# Patient Record
Sex: Female | Born: 1995 | Race: Black or African American | Hispanic: No | Marital: Single | Smoking: Current every day smoker
Health system: Southern US, Community
[De-identification: ages and names within clinical notes are randomized; demographics above are authoritative.]

## PROBLEM LIST (undated history)

## (undated) DIAGNOSIS — R0600 Dyspnea, unspecified: Secondary | ICD-10-CM

## (undated) DIAGNOSIS — M2550 Pain in unspecified joint: Secondary | ICD-10-CM

## (undated) DIAGNOSIS — F419 Anxiety disorder, unspecified: Secondary | ICD-10-CM

## (undated) DIAGNOSIS — R079 Chest pain, unspecified: Secondary | ICD-10-CM

## (undated) DIAGNOSIS — G4700 Insomnia, unspecified: Secondary | ICD-10-CM

## (undated) DIAGNOSIS — R5383 Other fatigue: Secondary | ICD-10-CM

## (undated) DIAGNOSIS — R600 Localized edema: Secondary | ICD-10-CM

## (undated) DIAGNOSIS — D649 Anemia, unspecified: Secondary | ICD-10-CM

## (undated) DIAGNOSIS — M549 Dorsalgia, unspecified: Secondary | ICD-10-CM

## (undated) HISTORY — DX: Anemia, unspecified: D64.9

## (undated) HISTORY — DX: Pain in unspecified joint: M25.50

## (undated) HISTORY — DX: Localized edema: R60.0

## (undated) HISTORY — DX: Dorsalgia, unspecified: M54.9

## (undated) HISTORY — PX: LIPOSUCTION: SHX10

## (undated) HISTORY — DX: Dyspnea, unspecified: R06.00

## (undated) HISTORY — DX: Other fatigue: R53.83

## (undated) HISTORY — DX: Chest pain, unspecified: R07.9

## (undated) HISTORY — PX: EYELID REPAIR W/ SKIN GRAFT: SHX1565

## (undated) HISTORY — PX: BUTTOCK LIFT: SHX1278

## (undated) HISTORY — DX: Insomnia, unspecified: G47.00

## (undated) HISTORY — DX: Anxiety disorder, unspecified: F41.9

---
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
## 2014-07-26 ENCOUNTER — Encounter (HOSPITAL_COMMUNITY): Payer: Self-pay | Admitting: Emergency Medicine

## 2014-07-26 ENCOUNTER — Emergency Department (INDEPENDENT_AMBULATORY_CARE_PROVIDER_SITE_OTHER)
Admission: EM | Admit: 2014-07-26 | Discharge: 2014-07-26 | Disposition: A | Discharge status: Home / Self Care | Payer: Self-pay | Source: Home / Self Care

## 2014-07-26 DIAGNOSIS — N390 Urinary tract infection, site not specified: Secondary | ICD-10-CM

## 2014-07-26 LAB — POCT URINALYSIS DIP (DEVICE)
Bilirubin Urine: NEGATIVE
Glucose, UA: NEGATIVE mg/dL
Ketones, ur: NEGATIVE mg/dL
Nitrite: POSITIVE — AB
Protein, ur: 30 mg/dL — AB
Specific Gravity, Urine: 1.015 (ref 1.005–1.030)
Urobilinogen, UA: 1 mg/dL (ref 0.0–1.0)
pH: 8 (ref 5.0–8.0)

## 2014-07-26 LAB — POCT PREGNANCY, URINE: Preg Test, Ur: NEGATIVE

## 2014-07-26 MED ORDER — CIPROFLOXACIN HCL 500 MG PO TABS: 500.0000 mg | ORAL_TABLET | Freq: Two times a day (BID) | ORAL | Status: DC

## 2014-07-26 NOTE — ED Notes (Signed)
C/o uti States she has pressure, odor and blood in urine

## 2014-07-26 NOTE — ED Provider Notes (Addendum)
CSN: [id number]     Arrival date & time 07/26/14  1857 History   First MD Initiated Contact with Patient 07/26/14 2011     Chief Complaint  Patient presents with  . Urinary Tract Infection   (Consider location/radiation/quality/duration/timing/severity/associated sxs/prior Treatment) Patient is a 19 y.o. female presenting with urinary tract infection. The history is provided by the patient.  Urinary Tract Infection Pain quality:  Burning Pain severity:  Mild Onset quality:  Gradual Duration:  2 days Timing:  Intermittent Progression:  Waxing and waning Chronicity:  New Recent urinary tract infections: no   Relieved by:  Nothing Worsened by:  Nothing tried Urinary symptoms: foul-smelling urine and frequent urination     History reviewed. No pertinent past medical history. History reviewed. No pertinent past surgical history. History reviewed. No pertinent family history. History  Substance Use Topics  . Smoking status: Never Smoker   . Smokeless tobacco: Not on file  . Alcohol Use: Yes     Comment: weekend drinker   OB History    No data available     Review of Systems  Constitutional: Negative.   HENT: Negative.   Eyes: Negative.   Respiratory: Negative.   Cardiovascular: Negative.   Gastrointestinal: Negative.   Genitourinary: Positive for dysuria and hematuria.  Musculoskeletal: Positive for myalgias.  Skin: Negative.   Neurological: Negative.     Allergies  Review of patient's allergies indicates no known allergies.  Home Medications   Prior to Admission medications   Medication Sig Start Date End Date Taking? Authorizing Provider  metroNIDAZOLE (FLAGYL) 500 MG tablet Take 1 tablet (500 mg total) by mouth 2 (two) times daily. 03/11/12   Morrell RiddleSarah L Weber, PA-C   BP 107/75 mmHg  Pulse 75  Temp(Src) 97.2 F (36.2 C) (Oral)  Resp 16  SpO2 100%  LMP 07/16/2014 Physical Exam  Constitutional: She is oriented to person, place, and time. She appears  well-developed and well-nourished.  HENT:  Head: Normocephalic and atraumatic.  Right Ear: External ear normal.  Left Ear: External ear normal.  Nose: Nose normal.  Mouth/Throat: Oropharynx is clear and moist.  Eyes: Conjunctivae and EOM are normal. Pupils are equal, round, and reactive to light.  Neck: Normal range of motion. Neck supple.  Pulmonary/Chest: Effort normal.  Abdominal: There is no tenderness.  Neurological: She is alert and oriented to person, place, and time.  Skin: Skin is warm and dry.  Psychiatric: She has a normal mood and affect. Her behavior is normal. Judgment and thought content normal.  Nursing note and vitals reviewed.   ED Course  Procedures (including critical care time) Labs Review Labs Reviewed  POCT URINALYSIS DIP (DEVICE) - Abnormal; Notable for the following:    Hgb urine dipstick SMALL (*)    Protein, ur 30 (*)    Nitrite POSITIVE (*)    Leukocytes, UA TRACE (*)    All other components within normal limits  POCT PREGNANCY, URINE    Imaging Review No results found.   MDM       ICD-9-CM ICD-10-CM   1. UTI (lower urinary tract infection) 599.0 N39.0 ciprofloxacin (CIPRO) 500 MG tablet     Signed, Elvina SidleKurt Corgan Mormile, MD   Elvina SidleKurt Benford Asch, MD 07/26/14 2030  Elvina SidleKurt Marijayne Rauth, MD 07/26/14 2043

## 2014-07-26 NOTE — Discharge Instructions (Signed)
Antibiotic Medication °Antibiotics are among the most frequently prescribed medicines. Antibiotics cure illness by assisting our body to injure or kill the bacteria that cause infection. While antibiotics are useful to treat a wide variety of infections they are useless against viruses. Antibiotics cannot cure colds, flu, or other viral infections.  °There are many types of antibiotics available. Your caregiver will decide which antibiotic will be useful for an illness. Never take or give someone else's antibiotics or left over medicine. °Your caregiver may also take into account: °· Allergies. °· The cost of the medicine. °· Dosing schedules. °· Taste. °· Common side effects when choosing an antibiotic for an infection. °Ask your caregiver if you have questions about why a certain medicine was chosen. °HOME CARE INSTRUCTIONS °Read all instructions and labels on medicine bottles carefully. Some antibiotics should be taken on an empty stomach while others should be taken with food. Taking antibiotics incorrectly may reduce how well they work. Some antibiotics need to be kept in the refrigerator. Others should be kept at room temperature. Ask your caregiver or pharmacist if you do not understand how to give the medicine. °Be sure to give the amount of medicine your caregiver has prescribed. Even if you feel better and your symptoms improve, bacteria may still remain alive in the body. Taking all of the medicine will prevent: °· The infection from returning and becoming harder to treat. °· Complications from partially treated infections. °If there is any medicine left over after you have taken the medicine as your caregiver has instructed, throw the medicine away. °Be sure to tell your caregiver if you: °· Are allergic to any medicines. °· Are pregnant or intend to become pregnant while using this medicine. °· Are breastfeeding. °· Are taking any other prescription, non-prescription medicine, or herbal  remedies. °· Have any other medical conditions or problems you have not already discussed. °If you are taking birth control pills, they may not work while you are on antibiotics. To avoid unwanted pregnancy: °· Continue taking your birth control pills as usual. °· Use a second form of birth control (such as condoms) while you are taking antibiotic medicine. °· When you finish taking the antibiotic medicine, continue using the second form of birth control until you are finished with your current 1 month cycle of birth control pills. °Try not to miss any doses of medicine. If you miss a dose, take it as soon as possible. However, if it is almost time for the next dose and the dosing schedule is: °· 2 doses a day, take the missed dose and the next dose 5 to 6 hours apart. °· 3 or more doses a day, take the missed dose and the next dose 2 to 4 hours apart, then go back to the normal schedule. °· If you are unable to make up a missed dose, take the next scheduled dose on time and complete the missed dose at the end of the prescribed time for your medicine. °SIDE EFFECTS TO TAKING ANTIBIOTICS °Common side effects to antibiotic use include: °· Soft stools or diarrhea. °· Mild stomach upset. °· Sun sensitivity. °SEEK MEDICAL CARE IF:  °· If you get worse or do not improve within a few days of starting the medicine. °· Vomiting develops. °· Diaper rash or rash on the genitals appears. °· Vaginal itching occurs. °· White patches appear on the tongue or in the mouth. °· Severe watery diarrhea and abdominal cramps occur. °· Signs of an allergy develop (hives, unknown   itchy rash appears). STOP TAKING THE ANTIBIOTIC. °SEEK IMMEDIATE MEDICAL CARE IF:  °· Urine turns dark or blood colored. °· Skin turns yellow. °· Easy bruising or bleeding occurs. °· Joint pain or muscle aches occur. °· Fever returns. °· Severe headache occurs. °· Signs of an allergy develop (trouble breathing, wheezing, swelling of the lips, face or tongue,  fainting, or blisters on the skin or in the mouth). STOP TAKING THE ANTIBIOTIC. °Document Released: 09/16/2003 Document Revised: 03/28/2011 Document Reviewed: 09/25/2008 °ExitCare® Patient Information ©2015 [employer]. This information is not intended to replace advice given to you by your health care provider. Make sure you discuss any questions you have with your health care provider. ° °Urinary Tract Infection °Urinary tract infections (UTIs) can develop anywhere along your urinary tract. Your urinary tract is your body's drainage system for removing wastes and extra water. Your urinary tract includes two kidneys, two ureters, a bladder, and a urethra. Your kidneys are a pair of bean-shaped organs. Each kidney is about the size of your fist. They are located below your ribs, one on each side of your spine. °CAUSES °Infections are caused by microbes, which are microscopic organisms, including fungi, viruses, and bacteria. These organisms are so small that they can only be seen through a microscope. Bacteria are the microbes that most commonly cause UTIs. °SYMPTOMS  °Symptoms of UTIs may vary by age and gender of the patient and by the location of the infection. Symptoms in young women typically include a frequent and intense urge to urinate and a painful, burning feeling in the bladder or urethra during urination. Older women and men are more likely to be tired, shaky, and weak and have muscle aches and abdominal pain. A fever may mean the infection is in your kidneys. Other symptoms of a kidney infection include pain in your back or sides below the ribs, nausea, and vomiting. °DIAGNOSIS °To diagnose a UTI, your caregiver will ask you about your symptoms. Your caregiver also will ask to provide a urine sample. The urine sample will be tested for bacteria and white blood cells. White blood cells are made by your body to help fight infection. °TREATMENT  °Typically, UTIs can be treated with medication. Because  most UTIs are caused by a bacterial infection, they usually can be treated with the use of antibiotics. The choice of antibiotic and length of treatment depend on your symptoms and the type of bacteria causing your infection. °HOME CARE INSTRUCTIONS °· If you were prescribed antibiotics, take them exactly as your caregiver instructs you. Finish the medication even if you feel better after you have only taken some of the medication. °· Drink enough water and fluids to keep your urine clear or pale yellow. °· Avoid caffeine, tea, and carbonated beverages. They tend to irritate your bladder. °· Empty your bladder often. Avoid holding urine for long periods of time. °· Empty your bladder before and after sexual intercourse. °· After a bowel movement, women should cleanse from front to back. Use each tissue only once. °SEEK MEDICAL CARE IF:  °· You have back pain. °· You develop a fever. °· Your symptoms do not begin to resolve within 3 days. °SEEK IMMEDIATE MEDICAL CARE IF:  °· You have severe back pain or lower abdominal pain. °· You develop chills. °· You have nausea or vomiting. °· You have continued burning or discomfort with urination. °MAKE SURE YOU:  °· Understand these instructions. °· Will watch your condition. °· Will get help right away   if you are not doing well or get worse. °Document Released: 10/13/2004 Document Revised: 07/05/2011 Document Reviewed: 02/11/2011 °ExitCare® Patient Information ©2015 [employer]. This information is not intended to replace advice given to you by your health care provider. Make sure you discuss any questions you have with your health care provider. ° °

## 2014-10-19 ENCOUNTER — Emergency Department (INDEPENDENT_AMBULATORY_CARE_PROVIDER_SITE_OTHER)
Admission: EM | Admit: 2014-10-19 | Discharge: 2014-10-19 | Disposition: A | Discharge status: Home / Self Care | Payer: Self-pay | Source: Home / Self Care | Attending: Emergency Medicine | Admitting: Emergency Medicine

## 2014-10-19 ENCOUNTER — Encounter (HOSPITAL_COMMUNITY): Payer: Self-pay | Admitting: Emergency Medicine

## 2014-10-19 DIAGNOSIS — T07XXXA Unspecified multiple injuries, initial encounter: Secondary | ICD-10-CM

## 2014-10-19 DIAGNOSIS — T148 Other injury of unspecified body region: Secondary | ICD-10-CM

## 2014-10-19 DIAGNOSIS — T148XXA Other injury of unspecified body region, initial encounter: Secondary | ICD-10-CM

## 2014-10-19 MED ORDER — IBUPROFEN 800 MG PO TABS: 800.0000 mg | ORAL_TABLET | Freq: Three times a day (TID) | ORAL | Status: DC

## 2014-10-19 MED ORDER — METHOCARBAMOL 500 MG PO TABS: 500.0000 mg | ORAL_TABLET | Freq: Two times a day (BID) | ORAL | Status: DC
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
## 2015-05-15 ENCOUNTER — Ambulatory Visit (INDEPENDENT_AMBULATORY_CARE_PROVIDER_SITE_OTHER): Payer: 59 | Admitting: Physician Assistant

## 2015-05-15 VITALS — BP 102/60 | HR 105 | Temp 98.3°F | Resp 16 | Ht 68.0 in | Wt 184.0 lb

## 2015-05-15 DIAGNOSIS — M545 Low back pain: Secondary | ICD-10-CM | POA: Diagnosis not present

## 2015-05-15 DIAGNOSIS — N12 Tubulo-interstitial nephritis, not specified as acute or chronic: Secondary | ICD-10-CM

## 2015-05-15 DIAGNOSIS — R300 Dysuria: Secondary | ICD-10-CM | POA: Diagnosis not present

## 2015-05-15 LAB — POCT URINALYSIS DIP (MANUAL ENTRY)
Bilirubin, UA: NEGATIVE
Glucose, UA: NEGATIVE
NITRITE UA: POSITIVE — AB
PH UA: 6.5
Spec Grav, UA: 1.02
UROBILINOGEN UA: 1

## 2015-05-15 LAB — POC MICROSCOPIC URINALYSIS (UMFC)

## 2015-05-15 MED ORDER — CIPROFLOXACIN HCL 500 MG PO TABS: 500.0000 mg | ORAL_TABLET | Freq: Two times a day (BID) | ORAL | Status: AC

## 2015-05-15 NOTE — Addendum Note (Signed)
Addended by: Trena PlattENGLISH, STEPHANIE D on: 05/15/2015 09:05 PM   Modules accepted: Level of Service, SmartSet

## 2015-05-15 NOTE — Patient Instructions (Addendum)
IF you received an x-ray today, you will receive an invoice from [facility]. Please contact [facility] at [phone] with questions or concerns regarding your invoice.   IF you received labwork today, you will receive an invoice from [facility]. Please contact [facility] at [phone] with questions or concerns regarding your invoice.   Our billing staff will not be able to assist you with questions regarding bills from these companies.  You will be contacted with the lab results as soon as they are available. The fastest way to get your results is to activate your My Chart account. Instructions are located on the last page of this paperwork. If you have not heard from us regarding the results in 2 weeks, please contact this office.    You can take ibuprofen or tylenol for the pain. You must push plenty of water.  64 oz or more.  Pyelonephritis, Adult Pyelonephritis is a kidney infection. The kidneys are the organs that filter a person's blood and move waste out of the bloodstream and into the urine. Urine passes from the kidneys, through the ureters, and into the bladder. There are two main types of pyelonephritis:  Infections that come on quickly without any warning (acute pyelonephritis).  Infections that last for a long period of time (chronic pyelonephritis). In most cases, the infection clears up with treatment and does not cause further problems. More severe infections or chronic infections can sometimes spread to the bloodstream or lead to other problems with the kidneys. CAUSES This condition is usually caused by:  Bacteria traveling from the bladder to the kidney through infected urine. The urine in the bladder can become infected with bacteria from:  Bladder infection (cystitis).  Inflammation of the prostate gland (prostatitis).  Sexual intercourse, in females.  Bacteria traveling from the bloodstream to the  kidney. RISK FACTORS This condition is more likely to develop in:  Pregnant women.  Older people.  People who have diabetes.  People who have kidney stones or bladder stones.  People who have other abnormalities of the kidney or ureter.  People who have a catheter placed in the bladder.  People who have cancer.  People who are sexually active.  Women who use spermicides.  People who have had a prior urinary tract infection. SYMPTOMS Symptoms of this condition include:  Frequent urination.  Strong or persistent urge to urinate.  Burning or stinging when urinating.  Abdominal pain.  Back pain.  Pain in the side or flank area.  Fever.  Chills.  Blood in the urine, or dark urine.  Nausea.  Vomiting. DIAGNOSIS This condition may be diagnosed based on:  Medical history and physical exam.  Urine tests.  Blood tests. You may also have imaging tests of the kidneys, such as an ultrasound or CT scan. TREATMENT Treatment for this condition may depend on the severity of the infection.  If the infection is mild and is found early, you may be treated with antibiotic medicines taken by mouth. You will need to drink fluids to remain hydrated.  If the infection is more severe, you may need to stay in the hospital and receive antibiotics given directly into a vein through an IV tube. You may also need to receive fluids through an IV tube if you are not able to remain hydrated. After your hospital stay, you may need to take oral antibiotics for a period of time. Other treatments may be required, depending on the cause of the infection.  HOME CARE INSTRUCTIONS Medicines  Take over-the-counter and prescription medicines only as told by your health care provider.  If you were prescribed an antibiotic medicine, take it as told by your health care provider. Do not stop taking the antibiotic even if you start to feel better. General Instructions  Drink enough fluid to keep  your urine clear or pale yellow.  Avoid caffeine, tea, and carbonated beverages. They tend to irritate the bladder.  Urinate often. Avoid holding in urine for long periods of time.  Urinate before and after sex.  After a bowel movement, women should cleanse from front to back. Use each tissue only once.  Keep all follow-up visits as told by your health care provider. This is important. SEEK MEDICAL CARE IF:  Your symptoms do not get better after 2 days of treatment.  Your symptoms get worse.  You have a fever. SEEK IMMEDIATE MEDICAL CARE IF:  You are unable to take your antibiotics or fluids.  You have shaking chills.  You vomit.  You have severe flank or back pain.  You have extreme weakness or fainting.   This information is not intended to replace advice given to you by your health care provider. Make sure you discuss any questions you have with your health care provider.   Document Released: 01/03/2005 Document Revised: 09/24/2014 Document Reviewed: 04/28/2014 Elsevier Interactive Patient Education [employer]

## 2015-05-15 NOTE — Progress Notes (Signed)
[facility] [address], [place] [address] [phone]  Date:  05/15/2015   Name:  Bethany ReachJessica Young   DOB:  06-27-1995   MRN:  [id number]  PCP:  No primary care provider on file.   Chief Complaint  Patient presents with  . Back Pain  . Urinary Tract Infection    Pt states she has an Uti  . Fatigue  . Dysuria   History of Present Illness:  Bethany ReachJessica Young is a 20 y.o. female patient who [facility] cc of dysuria.    2 weeks ago, frequency started and resolved, but then last week--she started to have full bodyaches.  She woke up with having a little fatigue and sleepy.  She has subjective fever and chills.  Back pain.  No nausea.  She has lower abdominal pain.  No dizziness.  She has been taking ibuprofen.  Urine is malodorous.  No odor.      There are no active problems to display for this patient.   History reviewed. No pertinent past medical history.  History reviewed. No pertinent past surgical history.  Social History  Substance Use Topics  . Smoking status: Never Smoker   . Smokeless tobacco: None  . Alcohol Use: Yes     Comment: weekend drinker    History reviewed. No pertinent family history.  No Known Allergies  Medication list has been reviewed and updated.  Current Outpatient Prescriptions on File Prior to Visit  Medication Sig Dispense Refill  . ibuprofen (ADVIL,MOTRIN) 800 MG tablet Take 1 tablet (800 mg total) by mouth 3 (three) times daily. 21 tablet 0  . ciprofloxacin (CIPRO) 500 MG tablet Take 1 tablet (500 mg total) by mouth 2 (two) times daily. (Patient not taking: Reported on 05/15/2015) 10 tablet 0  . methocarbamol (ROBAXIN) 500 MG tablet Take 1 tablet (500 mg total) by mouth 2 (two) times daily. (Patient not taking: Reported on 05/15/2015) 20 tablet 0  . metroNIDAZOLE (FLAGYL) 500 MG tablet Take 1 tablet (500 mg total) by mouth 2 (two) times daily. (Patient not taking: Reported on 05/15/2015) 14 tablet 0   No  current facility-administered medications on file prior to visit.    ROS ROS otherwise unremarkable unless listed above.   Physical Examination: BP 102/60 mmHg  Pulse 105  Temp(Src) 98.3 F (36.8 C) (Oral)  Resp 16  Ht 5\' 8"  (1.727 m)  Wt 184 lb (83.462 kg)  BMI 27.98 kg/m2  SpO2 93% Ideal Body Weight: Weight in (lb) to have BMI = 25: 164.1  Physical Exam  Constitutional: She is oriented to person, place, and time. She appears well-developed and well-nourished. No distress.  HENT:  Head: Normocephalic and atraumatic.  Right Ear: External ear normal.  Left Ear: External ear normal.  Eyes: Conjunctivae and EOM are normal. Pupils are equal, round, and reactive to light.  Cardiovascular: Normal rate.   Pulmonary/Chest: Effort normal. No respiratory distress.  Abdominal: Soft. Normal appearance and bowel sounds are normal. There is no hepatosplenomegaly. There is tenderness in the suprapubic area. There is CVA tenderness (very tender upon palpation <right side).    Neurological: She is alert and oriented to person, place, and time.  Skin: She is not diaphoretic.  Psychiatric: She has a normal mood and affect. Her behavior is normal.    Results for orders placed or performed in visit on 05/15/15  POCT Microscopic Urinalysis ([facility])  Result Value Ref Range   WBC,UR,HPF,POC Few (A) None WBC/hpf  RBC,UR,HPF,POC Few (A) None RBC/hpf   Bacteria Many (A) None, Too numerous to count   Mucus Present (A) Absent   Epithelial Cells, UR Per Microscopy Few (A) None, Too numerous to count cells/hpf  POCT urinalysis dipstick  Result Value Ref Range   Color, UA yellow yellow   Clarity, UA clear clear   Glucose, UA negative negative   Bilirubin, UA negative negative   Ketones, POC UA >= (160) (A) negative   Spec Grav, UA 1.020    Blood, UA small (A) negative   pH, UA 6.5    Protein Ur, POC =100 (A) negative   Urobilinogen, UA 1.0    Nitrite, UA Positive (A) Negative   Leukocytes, UA  small (1+) (A) Negative    Assessment and Plan: Bethany Young is a 20 y.o. female who is here today for cc of abdominal and back pain. This appears significant for kidney infection at this time.  I have advised heavy hydration.  She states that she has not been due to new painful braces of her lower teeth.  She can take ibuprofen or tylenol for the pain.  Obtaining urine culture.   Advised of alarming sxs to warrant immediate return.   Pyelonephritis - Plan: ciprofloxacin (CIPRO) 500 MG tablet  Dysuria - Plan: POCT Microscopic Urinalysis ([facility]), POCT urinalysis dipstick, Urine culture  Low back pain without sciatica, unspecified back pain laterality - Plan: POCT Microscopic Urinalysis ([facility]), POCT urinalysis dipstick  Trena Platt, PA-C [facility] 4/28/20171:54 PM   Trena Platt, PA-C [facility] 05/15/2015 1:16 PM

## 2015-05-17 LAB — URINE CULTURE: Colony Count: >=100000

## 2015-05-18 ENCOUNTER — Telehealth: Payer: Self-pay | Admitting: Emergency Medicine

## 2015-05-18 NOTE — Telephone Encounter (Signed)
Pt given test results. Advised to continue complete Cipro and call if symptoms do not improve.

## 2015-05-18 NOTE — Telephone Encounter (Signed)
-----   Message from Garnetta BuddyStephanie D English, [place] sent at 05/18/2015  8:27 AM EDT ----- Klebsiella, which is one of the more common urinary tract infections.  Continue the cipro to completion, as this will cover this well.

## 2015-09-13 ENCOUNTER — Ambulatory Visit (HOSPITAL_COMMUNITY)
Admission: EM | Admit: 2015-09-13 | Discharge: 2015-09-13 | Disposition: A | Discharge status: Home / Self Care | Payer: 59 | Attending: Emergency Medicine | Admitting: Emergency Medicine

## 2015-09-13 ENCOUNTER — Encounter (HOSPITAL_COMMUNITY): Payer: Self-pay | Admitting: *Deleted

## 2015-09-13 DIAGNOSIS — T677XXA Heat edema, initial encounter: Secondary | ICD-10-CM

## 2015-09-13 DIAGNOSIS — M722 Plantar fascial fibromatosis: Secondary | ICD-10-CM | POA: Diagnosis not present

## 2015-09-13 DIAGNOSIS — N390 Urinary tract infection, site not specified: Secondary | ICD-10-CM

## 2015-09-13 LAB — POCT URINALYSIS DIP (DEVICE)
Bilirubin Urine: NEGATIVE
Glucose, UA: NEGATIVE mg/dL
KETONES UR: NEGATIVE mg/dL
LEUKOCYTES UA: NEGATIVE
Nitrite: NEGATIVE
Protein, ur: NEGATIVE mg/dL
SPECIFIC GRAVITY, URINE: 1.015 (ref 1.005–1.030)
Urobilinogen, UA: 0.2 mg/dL (ref 0.0–1.0)
pH: 7 (ref 5.0–8.0)

## 2015-09-13 MED ORDER — CEPHALEXIN 500 MG PO CAPS: 500.0000 mg | ORAL_CAPSULE | Freq: Four times a day (QID) | ORAL | 0 refills | Status: DC

## 2015-09-13 NOTE — Discharge Instructions (Signed)
Wear comfortable shoes. Insert the full length she will insert with high arch for your foot pain. Roll your foot over a cold can to help with the plantar foot pain. Limit your standing and ambulation as much as she can. Drink plenty of fluids and stay well-hydrated. For UTI symptoms take AZO standard. Also take all of your antibiotic. You may need to follow-up with the urologist if you continue to have frequent UTIs. For swelling in your ankles and feet keep them elevated as much as possible, apply cold compresses and wear compression stockings as discussed.

## 2015-09-13 NOTE — ED Triage Notes (Signed)
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 Patient reports history of UTIs and states that for several days she has had bladder pressure with polyuria.

## 2015-09-13 NOTE — ED Provider Notes (Signed)
CSN: [id number]     Arrival date & time 09/13/15  1533 History   First MD Initiated Contact with Patient 09/13/15 1748     Chief Complaint  Patient presents with  . Polyuria  . Ankle Pain  . Joint Swelling   (Consider location/radiation/quality/duration/timing/severity/associated sxs/prior Treatment) 20 year old female with multiple complaints. Her first complaint is that of pressure with urination and some discomfort for the end of micturition. She states she drinks a lot of water and therefore she has urinary frequency but cannot tell her she needs difference. She has a history of UTIs and pyelonephritis. Denies chills, fever, back pain or pelvic pain.  Second complaint is that of swelling in the ankles and feet since the beginning of summer. 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 She states the swelling tends to go down at night time and gets worse and during the day and evening. She also states that oftentimes she does not wear supportive shoes.  Third complaint is that of foot pain. Initially she had difficulty in describing the area of pain and had to stand on her feet to recall the specific area. She states that she is having pain in the plantar aspect of her heel. She states it tends to get worse during the day and is not necessarily worse when first planting her feet on the floor in the morning. Denies any sort of trauma, injury such as ankle injury or falls.      History reviewed. No pertinent past medical history. History reviewed. No pertinent surgical history. History reviewed. No pertinent family history. Social History  Substance Use Topics  . Smoking status: Never Smoker  . Smokeless tobacco: Never Used  . Alcohol use Yes     Comment: weekend drinker   OB History    No data available     Review of Systems  Constitutional: Positive for activity change. Negative for fatigue and fever.   HENT: Negative.   Eyes: Negative.   Respiratory: Negative.  Negative for cough and shortness of breath.   Cardiovascular: Positive for leg swelling. Negative for chest pain and palpitations.  Gastrointestinal: Negative.   Genitourinary: Positive for dysuria and frequency. Negative for decreased urine volume, pelvic pain, vaginal bleeding, vaginal discharge and vaginal pain.  Musculoskeletal: Negative for back pain.       As per history of present illness  Skin: Negative.   Neurological: Negative.     Allergies  Review of patient's allergies indicates no known allergies.  Home Medications   Prior to Admission medications   Medication Sig Start Date End Date Taking? Authorizing Provider  cephALEXin (KEFLEX) 500 MG capsule Take 1 capsule (500 mg total) by mouth 4 (four) times daily. 09/13/15   Hayden Rasmussen, NP  ibuprofen (ADVIL,MOTRIN) 800 MG tablet Take 1 tablet (800 mg total) by mouth 3 (three) times daily. 10/19/14   Elson Areas, PA-C   Meds Ordered and Administered this Visit  Medications - No data to display  BP 112/69 (BP Location: Left Arm)   Pulse 73   Temp 98.1 F (36.7 C) (Oral)   Resp 16   Ht 5\' 8"  (1.727 m)   Wt 192 lb (87.1 kg)   LMP 09/06/2015   SpO2 100%   BMI 29.19 kg/m  No data found.   Physical Exam  Constitutional: She is oriented to person, place, and time. She appears well-developed and  well-nourished. No distress.  HENT:  Head: Normocephalic and atraumatic.  Eyes: EOM are normal.  Neck: Normal range of motion. Neck supple.  Cardiovascular: Normal rate.   Pulmonary/Chest: Effort normal. No respiratory distress.  Musculoskeletal: Normal range of motion.  Minor 1+ nonpitting edema of the ankles and feet. No discoloration. No deformity. Full range of motion of the ankle, feet and toes. Normal warmth and color. Pedal pulses 2+ bilaterally. Firm palpation of the plantar aspect of the heels reproduces the pain for which she has had for several weeks. No  apparent injury, deformity or skin defects noted. Normal foot architecture.  Lymphadenopathy:    She has no cervical adenopathy.  Neurological: She is alert and oriented to person, place, and time. No cranial nerve deficit.  Skin: Skin is warm and dry. Capillary refill takes less than 2 seconds.  Psychiatric: She has a normal mood and affect.  Nursing note and vitals reviewed.   Urgent Care Course   Clinical Course    Procedures (including critical care time)  Labs Review Labs Reviewed  POCT URINALYSIS DIP (DEVICE) - Abnormal; Notable for the following:       Result Value   Hgb urine dipstick TRACE (*)    All other components within normal limits    Imaging Review No results found.   Visual Acuity Review  Right Eye Distance:   Left Eye Distance:   Bilateral Distance:    Right Eye Near:   Left Eye Near:    Bilateral Near:         MDM   1. Heat edema, initial encounter   2. Plantar fasciitis, bilateral   3. UTI (lower urinary tract infection)    Wear comfortable shoes. Insert the full length she will insert with high arch for your foot pain. Roll your feet over a cold can to help with the plantar foot pain. Limit your standing and ambulation as much as she can. Drink plenty of fluids and stay well-hydrated. For UTI symptoms take AZO standard. Also take all of your antibiotic. You may need to follow-up with the urologist if you continue to have frequent UTIs. For swelling in your ankles and feet keep them elevated as much as possible, apply cold compresses and wear compression stockings as discussed. Meds ordered this encounter  Medications  . cephALEXin (KEFLEX) 500 MG capsule    Sig: Take 1 capsule (500 mg total) by mouth 4 (four) times daily.    Dispense:  28 capsule    Refill:  0    Order Specific Question:   Supervising Provider    Answer:   Eustace MooreMURRAY, LAURA W [[id number]]       Hayden Rasmussenavid Gracemarie Skeet, NP 09/13/15 1825

## 2015-10-24 ENCOUNTER — Emergency Department (HOSPITAL_COMMUNITY): Payer: 59

## 2015-10-24 ENCOUNTER — Emergency Department (HOSPITAL_COMMUNITY)
Admission: EM | Admit: 2015-10-24 | Discharge: 2015-10-24 | Disposition: A | Discharge status: Home / Self Care | Payer: 59 | Attending: Emergency Medicine | Admitting: Emergency Medicine

## 2015-10-24 ENCOUNTER — Encounter (HOSPITAL_COMMUNITY): Payer: Self-pay | Admitting: *Deleted

## 2015-10-24 DIAGNOSIS — Y9389 Activity, other specified: Secondary | ICD-10-CM | POA: Insufficient documentation

## 2015-10-24 DIAGNOSIS — Y999 Unspecified external cause status: Secondary | ICD-10-CM | POA: Diagnosis not present

## 2015-10-24 DIAGNOSIS — R51 Headache: Secondary | ICD-10-CM | POA: Insufficient documentation

## 2015-10-24 DIAGNOSIS — Y929 Unspecified place or not applicable: Secondary | ICD-10-CM | POA: Insufficient documentation

## 2015-10-24 DIAGNOSIS — W108XXA Fall (on) (from) other stairs and steps, initial encounter: Secondary | ICD-10-CM | POA: Diagnosis not present

## 2015-10-24 DIAGNOSIS — S81812A Laceration without foreign body, left lower leg, initial encounter: Secondary | ICD-10-CM

## 2015-10-24 DIAGNOSIS — W19XXXA Unspecified fall, initial encounter: Secondary | ICD-10-CM

## 2015-10-24 LAB — RAPID HIV SCREEN (HIV 1/2 AB+AG)
HIV 1/2 Antibodies: NONREACTIVE
HIV-1 P24 Antigen - HIV24: NONREACTIVE

## 2015-10-24 LAB — I-STAT BETA HCG BLOOD, ED (MC, WL, AP ONLY)

## 2015-10-24 LAB — HEPATITIS C ANTIBODY (REFLEX)

## 2015-10-24 MED ORDER — ONDANSETRON HCL 4 MG/2ML IJ SOLN
4.0000 mg | Freq: Once | INTRAMUSCULAR | Status: AC
  Administered 2015-10-24: 4 mg via INTRAVENOUS
  Filled 2015-10-24: qty 2

## 2015-10-24 MED ORDER — CEFAZOLIN IN D5W 1 GM/50ML IV SOLN
1.0000 g | Freq: Once | INTRAVENOUS | Status: AC
  Administered 2015-10-24: 1 g via INTRAVENOUS
  Filled 2015-10-24: qty 50

## 2015-10-24 MED ORDER — CEFAZOLIN SODIUM 1 G IJ SOLR: 1.0000 g | Freq: Once | INTRAMUSCULAR | Status: DC

## 2015-10-24 MED ORDER — LIDOCAINE-EPINEPHRINE (PF) 2 %-1:200000 IJ SOLN: 20.0000 mL | Freq: Once | INTRAMUSCULAR | Status: DC

## 2015-10-24 MED ORDER — LIDOCAINE-EPINEPHRINE 1 %-1:100000 IJ SOLN
20.0000 mL | Freq: Once | INTRAMUSCULAR | Status: AC
  Administered 2015-10-24: 20 mL
  Filled 2015-10-24: qty 1

## 2015-10-24 MED ORDER — CEFAZOLIN IN D5W 1 GM/50ML IV SOLN: 1.0000 g | Freq: Once | INTRAVENOUS | Status: DC

## 2015-10-24 MED ORDER — SODIUM CHLORIDE 0.9 % IV BOLUS (SEPSIS)
1000.0000 mL | Freq: Once | INTRAVENOUS | Status: AC
  Administered 2015-10-24: 1000 mL via INTRAVENOUS
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
## 2015-10-24 NOTE — Discharge Instructions (Signed)
Treatment: Keep your wound dry and dressing applied until this time tomorrow. After 24 hours, you may wash with warm soapy water. Dry and apply antibiotic ointment and clean dressing. Do this daily until your sutures are removed. You can take Tylenol as needed for pain control.  Follow-up: Please follow-up with your primary care provider or return to emergency department in 10-14 days for suture removal. Be aware of signs of infection: fever, increasing pain, redness, swelling, drainage from the area, joint pain that isn't resolving. Please call your primary care provider or return to emergency department if you develop any of these symptoms or if any of the sutures come out prior to removal. Please return to the emergency department if you develop any other new or worsening symptoms.

## 2015-10-24 NOTE — ED Notes (Signed)
Patient transported to X-ray 

## 2015-10-24 NOTE — ED Notes (Addendum)
Patient ambulatory to bathroom, pt refused to put on socks and refusing assistance with wheelchair. Pt and visitors ask to refrain from cursing while in halls.

## 2015-10-24 NOTE — ED Notes (Signed)
Patient transported to CT 

## 2015-10-24 NOTE — ED Notes (Signed)
PA at bedside.

## 2015-10-24 NOTE — ED Notes (Signed)
MD at bedside. 

## 2015-10-24 NOTE — ED Notes (Signed)
Radiology made aware that patient can go for xrays when they are ready.

## 2015-10-24 NOTE — ED Notes (Signed)
Pt. Verbalized understanding of discharge and visit today. Being driven home by friend. VSS NAD.

## 2015-10-24 NOTE — ED Notes (Signed)
Pt to radiology via stretcher. Pt refused to be transported until staff put socks on her feet.

## 2015-10-24 NOTE — ED Notes (Signed)
Pt brought back from radiology without having xrays completed d/t emesis. Pt requesting medication for same

## 2016-05-02 ENCOUNTER — Emergency Department (HOSPITAL_COMMUNITY)
Admission: EM | Admit: 2016-05-02 | Discharge: 2016-05-02 | Payer: 59 | Attending: Emergency Medicine | Admitting: Emergency Medicine

## 2016-05-02 ENCOUNTER — Encounter (HOSPITAL_COMMUNITY): Payer: Self-pay | Admitting: Emergency Medicine

## 2016-05-02 ENCOUNTER — Emergency Department (HOSPITAL_COMMUNITY): Payer: 59

## 2016-05-02 DIAGNOSIS — S0182XA Laceration with foreign body of other part of head, initial encounter: Secondary | ICD-10-CM | POA: Diagnosis not present

## 2016-05-02 DIAGNOSIS — S0990XA Unspecified injury of head, initial encounter: Secondary | ICD-10-CM | POA: Insufficient documentation

## 2016-05-02 DIAGNOSIS — Y999 Unspecified external cause status: Secondary | ICD-10-CM | POA: Diagnosis not present

## 2016-05-02 DIAGNOSIS — M25562 Pain in left knee: Secondary | ICD-10-CM | POA: Insufficient documentation

## 2016-05-02 DIAGNOSIS — M25571 Pain in right ankle and joints of right foot: Secondary | ICD-10-CM | POA: Insufficient documentation

## 2016-05-02 DIAGNOSIS — S01111A Laceration without foreign body of right eyelid and periocular area, initial encounter: Secondary | ICD-10-CM | POA: Insufficient documentation

## 2016-05-02 DIAGNOSIS — M542 Cervicalgia: Secondary | ICD-10-CM | POA: Insufficient documentation

## 2016-05-02 DIAGNOSIS — Y92410 Unspecified street and highway as the place of occurrence of the external cause: Secondary | ICD-10-CM | POA: Diagnosis not present

## 2016-05-02 DIAGNOSIS — Y939 Activity, unspecified: Secondary | ICD-10-CM | POA: Diagnosis not present

## 2016-05-02 DIAGNOSIS — S0181XA Laceration without foreign body of other part of head, initial encounter: Secondary | ICD-10-CM | POA: Diagnosis present

## 2016-05-02 DIAGNOSIS — R0789 Other chest pain: Secondary | ICD-10-CM | POA: Diagnosis not present

## 2016-05-02 MED ORDER — MORPHINE SULFATE (PF) 4 MG/ML IV SOLN
4.0000 mg | Freq: Once | INTRAVENOUS | Status: AC
  Administered 2016-05-02: 4 mg via INTRAVENOUS
  Filled 2016-05-02: qty 1

## 2016-05-02 MED ORDER — LIDOCAINE HCL (PF) 1 % IJ SOLN
30.0000 mL | Freq: Once | INTRAMUSCULAR | Status: AC
  Administered 2016-05-02: 30 mL
  Filled 2016-05-02: qty 30

## 2016-05-02 MED ORDER — CEFAZOLIN SODIUM-DEXTROSE 2-4 GM/100ML-% IV SOLN
2.0000 g | Freq: Once | INTRAVENOUS | Status: AC
  Administered 2016-05-02: 2 g via INTRAVENOUS
  Filled 2016-05-02: qty 100

## 2016-05-02 NOTE — Progress Notes (Signed)
Orthopedic Tech Progress Note Patient Details:  Bethany Young [birth year] [id number]  Ortho Devices Type of Ortho Device: Knee Sleeve, Ankle splint, Crutches Ortho Device/Splint Interventions: Application   Saul Fordyce 05/02/2016, 12:31 PM

## 2016-05-02 NOTE — ED Notes (Signed)
Pt transported to CT ?

## 2016-05-02 NOTE — ED Notes (Signed)
Patient returned from radiology

## 2016-05-02 NOTE — ED Notes (Signed)
Paged Ortho  

## 2016-05-02 NOTE — ED Notes (Signed)
Patient not in room. Father and 2 friends in room.
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
## 2016-05-02 NOTE — ED Notes (Signed)
ENT Doctor at bedside.  
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
## 2016-05-02 NOTE — ED Notes (Signed)
ED provider at bedside.

## 2016-05-02 NOTE — ED Notes (Signed)
Patient not in room. Father, Mother, and step Father in room.
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
## 2016-05-02 NOTE — Discharge Instructions (Addendum)
Treatment: Take care of your wound as directed by Dr. Suszanne Conners.  Follow-up: Please follow-up with Dr. Suszanne Conners in 1 week. Please return to the emergency department

## 2016-05-02 NOTE — ED Notes (Signed)
[facility] arrived  

## 2016-05-12 ENCOUNTER — Ambulatory Visit: Payer: 59

## 2016-05-12 ENCOUNTER — Encounter (HOSPITAL_COMMUNITY): Payer: Self-pay | Admitting: Emergency Medicine

## 2016-05-12 ENCOUNTER — Ambulatory Visit (HOSPITAL_COMMUNITY)
Admission: EM | Admit: 2016-05-12 | Discharge: 2016-05-12 | Disposition: A | Discharge status: Home / Self Care | Payer: 59 | Attending: Internal Medicine | Admitting: Internal Medicine

## 2016-05-12 DIAGNOSIS — S8002XD Contusion of left knee, subsequent encounter: Secondary | ICD-10-CM | POA: Diagnosis not present

## 2016-05-12 DIAGNOSIS — S93401D Sprain of unspecified ligament of right ankle, subsequent encounter: Secondary | ICD-10-CM

## 2016-05-12 DIAGNOSIS — M25562 Pain in left knee: Secondary | ICD-10-CM

## 2016-05-12 NOTE — Discharge Instructions (Signed)
Right ankle continues to be swollen with pain likely due to too much weightbearing and walking. Wear the shoe brace that was given to you at [facility] during the day and at home he may want to remove it and replace it with an Ace bandage. This may be more comfortable. The more you walk on this for longer able take to heal and the more pain you will half. He will take just a few days to improve when she been off your feet. The left knee continues to have pain on the inside part due to a hematoma. This will remain tender for several days and the discoloration and hematoma will gradually absorb over about 3 week period.
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
## 2016-10-05 ENCOUNTER — Ambulatory Visit (HOSPITAL_COMMUNITY)
Admission: EM | Admit: 2016-10-05 | Discharge: 2016-10-05 | Disposition: A | Discharge status: Home / Self Care | Payer: 59 | Attending: Family Medicine | Admitting: Family Medicine

## 2016-10-05 ENCOUNTER — Encounter (HOSPITAL_COMMUNITY): Payer: Self-pay | Admitting: Emergency Medicine

## 2016-10-05 DIAGNOSIS — L259 Unspecified contact dermatitis, unspecified cause: Secondary | ICD-10-CM

## 2016-10-05 DIAGNOSIS — N3090 Cystitis, unspecified without hematuria: Secondary | ICD-10-CM | POA: Insufficient documentation

## 2016-10-05 LAB — POCT URINALYSIS DIP (DEVICE)
Bilirubin Urine: NEGATIVE
Glucose, UA: NEGATIVE mg/dL
Ketones, ur: NEGATIVE mg/dL
Nitrite: NEGATIVE
PROTEIN: 30 mg/dL — AB
SPECIFIC GRAVITY, URINE: 1.025 (ref 1.005–1.030)
UROBILINOGEN UA: 0.2 mg/dL (ref 0.0–1.0)
pH: 6 (ref 5.0–8.0)

## 2016-10-05 MED ORDER — SULFAMETHOXAZOLE-TRIMETHOPRIM 800-160 MG PO TABS: 1.0000 | ORAL_TABLET | Freq: Two times a day (BID) | ORAL | 0 refills | Status: AC

## 2016-10-05 MED ORDER — TRIAMCINOLONE ACETONIDE 0.1 % EX CREA: 1.0000 "application " | TOPICAL_CREAM | Freq: Two times a day (BID) | CUTANEOUS | 0 refills | Status: DC

## 2016-10-05 NOTE — ED Triage Notes (Signed)
The patient presented to the [facility] with a complaint of dysuria, urinary frequency and odor x 1 week.

## 2016-10-08 ENCOUNTER — Telehealth (HOSPITAL_COMMUNITY): Payer: Self-pay | Admitting: Internal Medicine

## 2016-10-08 LAB — URINE CULTURE

## 2016-10-08 MED ORDER — CEPHALEXIN 500 MG PO CAPS: 500.0000 mg | ORAL_CAPSULE | Freq: Two times a day (BID) | ORAL | 0 refills | Status: AC

## 2016-10-08 NOTE — Telephone Encounter (Signed)
Please let patient know that urine culture was positive for E coli germ, resistant to trimethoprim/sulfa rx given at the urgent care visit 9/19.  Stop trimethoprim/sulfa.  Rx cephalexin sent to the pharmacy of record, [facility] on [address].  Take all of the cephalexin.  Recheck for further evaluation if symptoms are not improving.  LM

## 2016-10-08 NOTE — ED Provider Notes (Signed)
  [facility]    ASSESSMENT & PLAN:  1. Cystitis   2. Contact dermatitis, unspecified contact dermatitis type, unspecified trigger     Meds ordered this encounter  Medications  . sulfamethoxazole-trimethoprim (BACTRIM DS,SEPTRA DS) 800-160 MG tablet    Sig: Take 1 tablet by mouth 2 (two) times daily.    Dispense:  6 tablet    Refill:  0  . triamcinolone cream (KENALOG) 0.1 %    Sig: Apply 1 application topically 2 (two) times daily.    Dispense:  15 g    Refill:  0    Urine culture sent. Will notify patient when results available. Will follow up with her PCP or here if not showing improvement over the next 48 hours, sooner if needed.  Outlined signs and symptoms indicating need for more acute intervention. Patient verbalized understanding. After Visit Summary given.  SUBJECTIVE:  Bethany Young is a 21 y.o. female who complains of urinary frequency, urgency and dysuria for the past few days. No flank pain, fever, chills, abnormal vaginal discharge or bleeding. Hematuria: not present.  Normal PO intake. No flank or abdominal pain. No self treatment.  LMP: Patient's last menstrual period was 09/05/2016 (exact date).  Also would like refill of steroid cream. Occasional contact dermatitis with unknown trigger.  ROS: As in HPI.  OBJECTIVE:  Vitals:   10/05/16 1846  BP: 93/65  Pulse: (!) 109  Resp: 18  Temp: 98.7 F (37.1 C)  TempSrc: Oral  SpO2: 100%   Appears well, in no apparent distress. Abdomen is soft without tenderness, guarding, mass, rebound or organomegaly. No CVA tenderness or inguinal adenopathy noted.  Labs Reviewed  URINE CULTURE - Abnormal; Notable for the following:       Result Value   Culture >=100,000 COLONIES/mL ESCHERICHIA COLI (*)    Organism ID, Bacteria ESCHERICHIA COLI (*)    All other components within normal limits  POCT URINALYSIS DIP (DEVICE) - Abnormal; Notable for the following:    Hgb urine dipstick TRACE (*)    Protein,  ur 30 (*)    Leukocytes, UA SMALL (*)    All other components within normal limits    No Known Allergies   Social History   Social History  . Marital status: Single    Spouse name: N/A  . Number of children: N/A  . Years of education: N/A   Occupational History  . Not on file.   Social History Main Topics  . Smoking status: Never Smoker  . Smokeless tobacco: Never Used  . Alcohol use Yes     Comment: weekend drinker  . Drug use: No  . Sexual activity: Yes    Birth control/ protection: Condom   Other Topics Concern  . Not on file   Social History Narrative  . No narrative on file   History reviewed. No pertinent family history.     Mardella Layman, MD 10/08/16 1009

## 2016-11-13 IMAGING — CT CT HEAD W/O CM
4 series · 17 of 47 positions shown, 19 images · non-contrast
Comparison: None.

CLINICAL DATA: Fall down stairs.  Initial encounter.

EXAM:
CT HEAD WITHOUT CONTRAST
TECHNIQUE: Contiguous axial images were obtained from the base of the skull
through the vertex without intravenous contrast.

[Series 2: head without · axial · non-contrast · 0.42mm/px · z∈[-99,+21]mm · 7 of 33 slices shown, 9 images]
[im 5/33  brain]
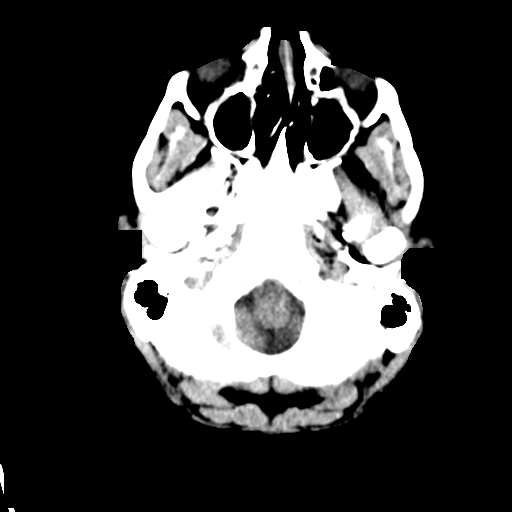
[im 5/33  bone]
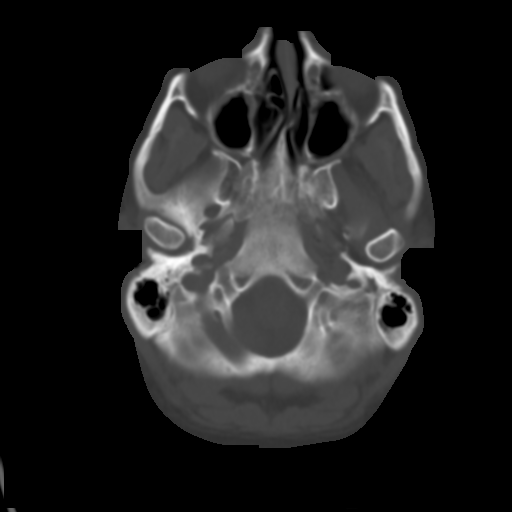
[im 9/33  brain]
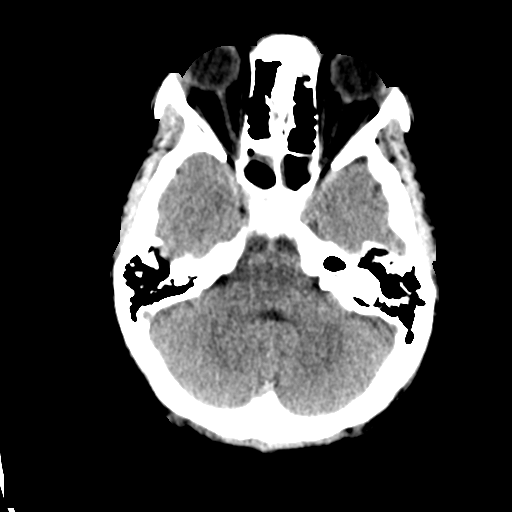
[im 13/33  brain]
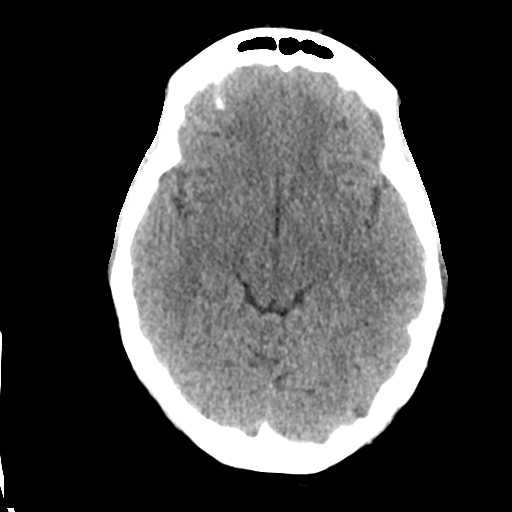
[im 17/33  brain]
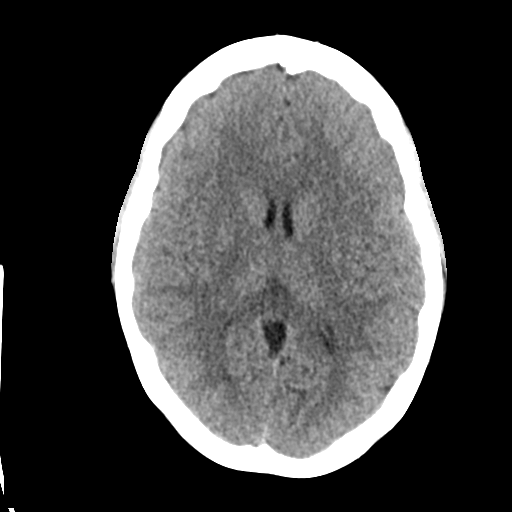
[im 21/33  brain]
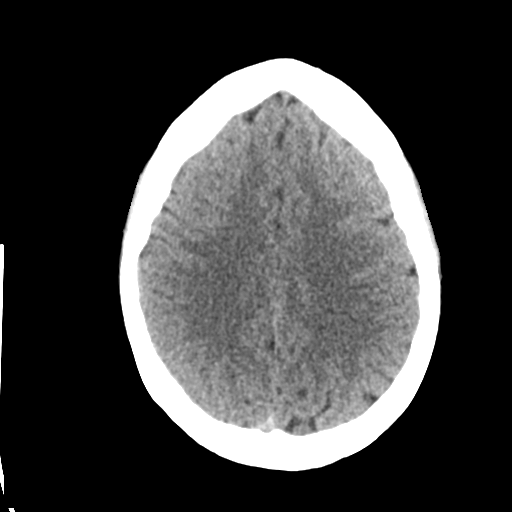
[im 21/33  bone]
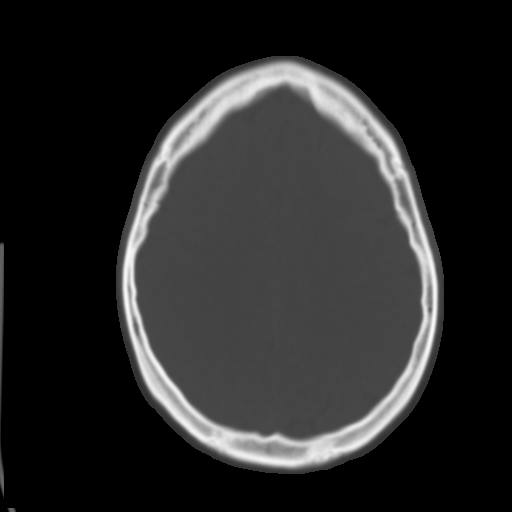
[im 25/33  brain]
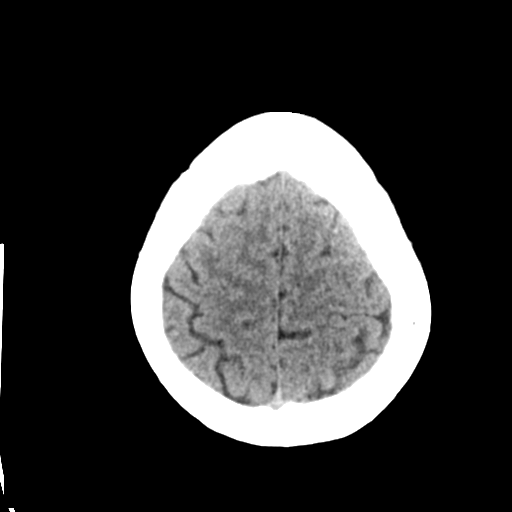
[im 29/33  brain]
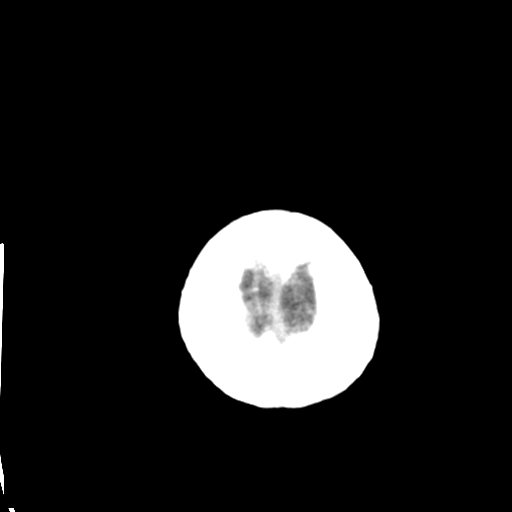

[Series 3: head bone · axial · 0.42mm/px · z∈[-103,-47]mm · 4 of 81 slices shown]
[im 9/81  bone]
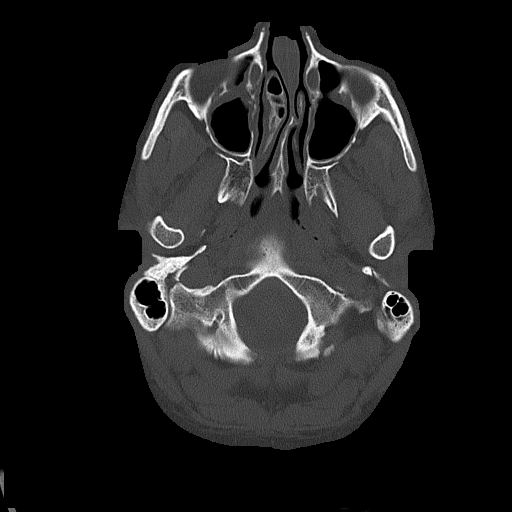
[im 17/81  bone]
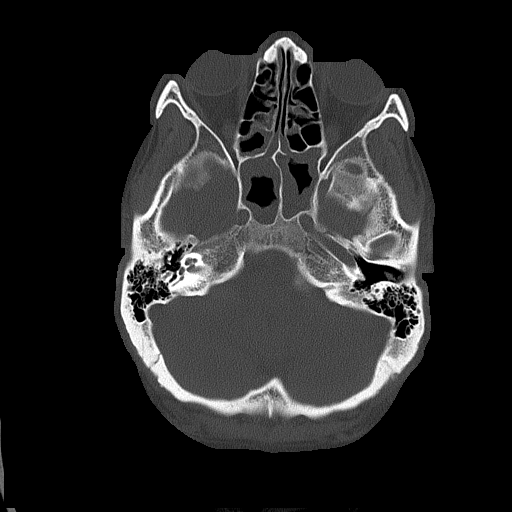
[im 25/81  bone]
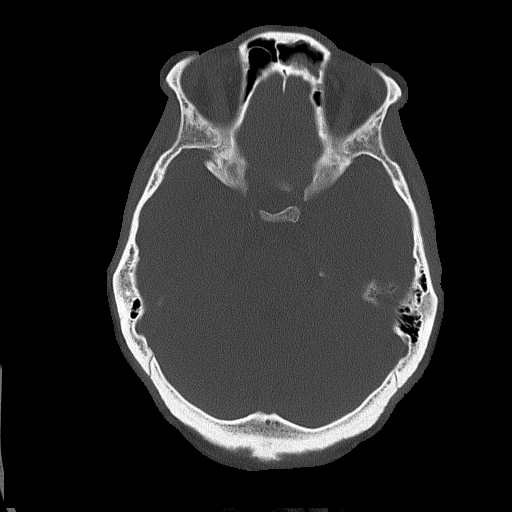
[im 37/81  bone]
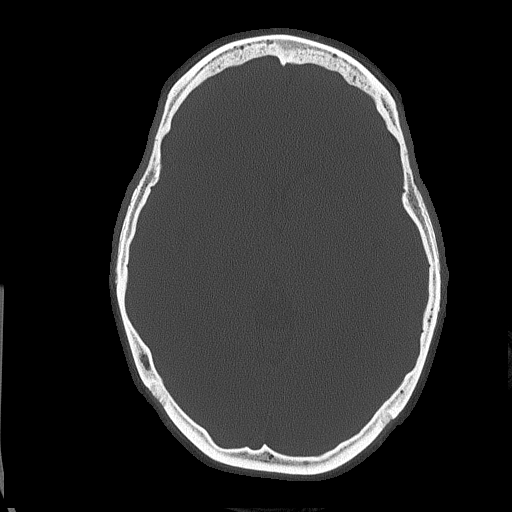

[Series 4: head without cor · coronal · non-contrast · 0.30mm/px · 3 of 67 slices shown]
[im 23/67  brain]
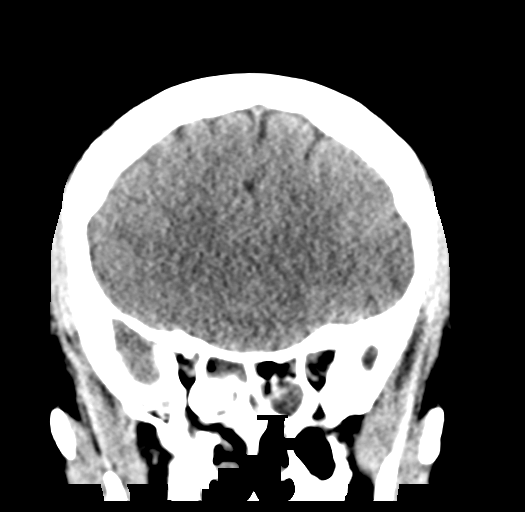
[im 30/67  brain]
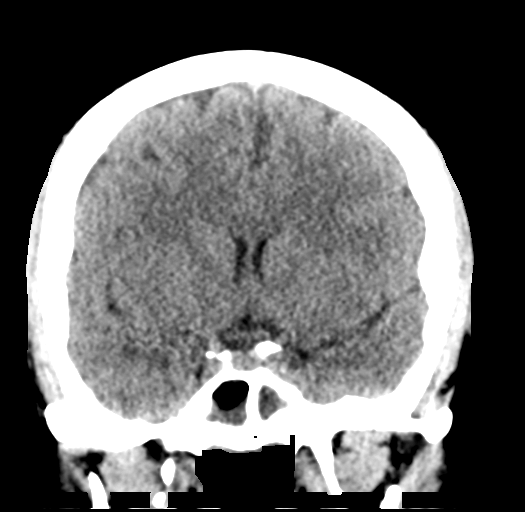
[im 37/67  brain]
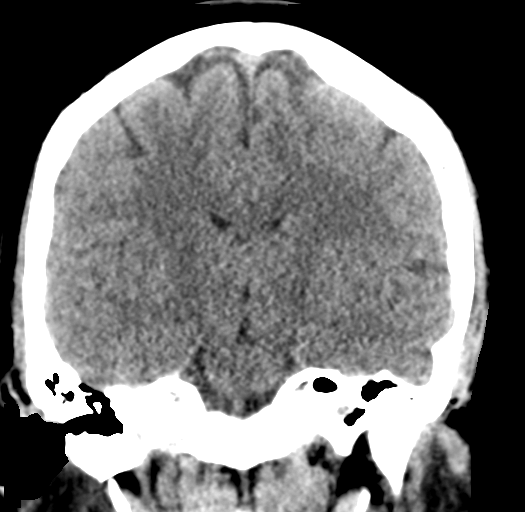

[Series 5: head without sag · sagittal · non-contrast · 0.29mm/px · 3 of 67 slices shown]
[im 23/67  brain]
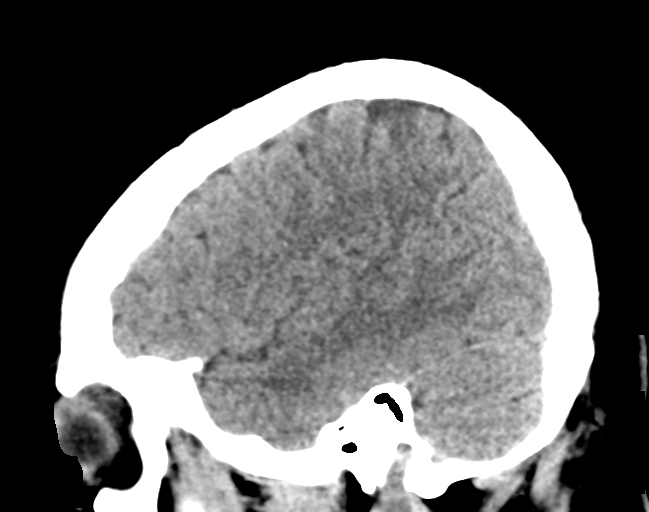
[im 34/67  brain]
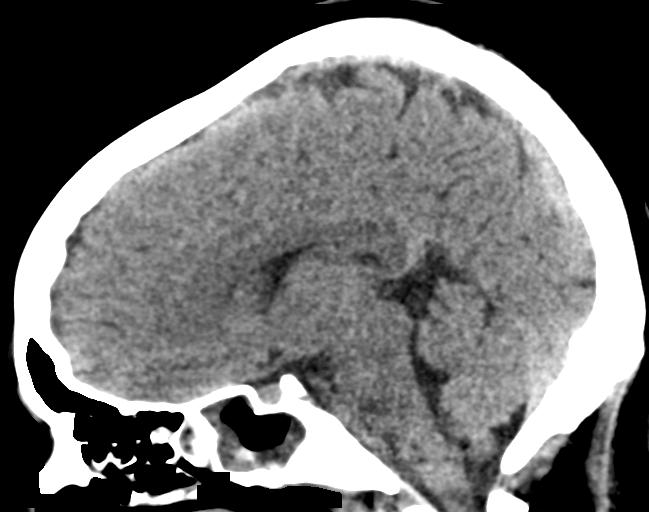
[im 45/67  brain]
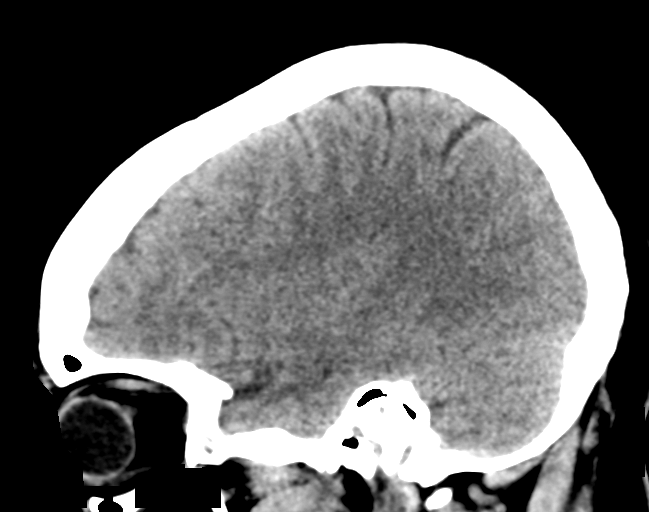

[17 of 47 positions shown; findings below may reference images not displayed]

FINDINGS: Brain: No evidence of acute infarction, hemorrhage, hydrocephalus,
extra-axial collection or mass lesion/mass effect.

Vascular: No hyperdense vessel or unexpected calcification.

Skull: Normal. Negative for fracture or focal lesion.

Sinuses/Orbits: Mucosal thickening present with an bilateral
ethmoid, sphenoid and maxillary sinuses.

Other: None.
IMPRESSION: No acute injury identified.  Mucosal sinus disease present.
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
## 2016-12-01 ENCOUNTER — Encounter (HOSPITAL_COMMUNITY): Payer: Self-pay | Admitting: Family Medicine

## 2016-12-01 ENCOUNTER — Ambulatory Visit (INDEPENDENT_AMBULATORY_CARE_PROVIDER_SITE_OTHER): Payer: 59

## 2016-12-01 ENCOUNTER — Ambulatory Visit (HOSPITAL_COMMUNITY)
Admission: EM | Admit: 2016-12-01 | Discharge: 2016-12-01 | Disposition: A | Discharge status: Home / Self Care | Payer: 59 | Attending: Emergency Medicine | Admitting: Emergency Medicine

## 2016-12-01 DIAGNOSIS — S92141G Displaced dome fracture of right talus, subsequent encounter for fracture with delayed healing: Secondary | ICD-10-CM | POA: Diagnosis not present

## 2016-12-01 NOTE — Discharge Instructions (Signed)
Wear the ankle strap to keep the ankle immobilized. Use the crutches with no weightbearing for now. Call the orthopedist as soon as possible for an appointment in follow-up. Elevate the ankle. can try ice for swelling. Limit stress on the ankle.
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
## 2017-03-09 ENCOUNTER — Ambulatory Visit (INDEPENDENT_AMBULATORY_CARE_PROVIDER_SITE_OTHER): Payer: Self-pay

## 2017-03-09 ENCOUNTER — Encounter (INDEPENDENT_AMBULATORY_CARE_PROVIDER_SITE_OTHER): Payer: Self-pay | Admitting: Orthopedic Surgery

## 2017-03-09 ENCOUNTER — Ambulatory Visit (INDEPENDENT_AMBULATORY_CARE_PROVIDER_SITE_OTHER): Payer: 59 | Admitting: Orthopedic Surgery

## 2017-03-09 VITALS — Ht 68.0 in | Wt 197.0 lb

## 2017-03-09 DIAGNOSIS — M25571 Pain in right ankle and joints of right foot: Secondary | ICD-10-CM

## 2017-03-09 DIAGNOSIS — M6701 Short Achilles tendon (acquired), right ankle: Secondary | ICD-10-CM
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
## 2017-03-09 NOTE — Addendum Note (Signed)
Addended by: Rodena MedinFORREST, Markee Remlinger L on: 03/09/2017 10:49 AM   Modules accepted: Orders

## 2017-04-03 ENCOUNTER — Encounter (INDEPENDENT_AMBULATORY_CARE_PROVIDER_SITE_OTHER): Payer: Self-pay | Admitting: *Deleted

## 2017-04-06 ENCOUNTER — Ambulatory Visit (INDEPENDENT_AMBULATORY_CARE_PROVIDER_SITE_OTHER): Payer: 59 | Admitting: Orthopedic Surgery

## 2017-04-21 ENCOUNTER — Ambulatory Visit
Admission: RE | Admit: 2017-04-21 | Discharge: 2017-04-21 | Disposition: A | Discharge status: Home / Self Care | Payer: 59 | Source: Ambulatory Visit | Attending: Orthopedic Surgery | Admitting: Orthopedic Surgery

## 2017-04-21 DIAGNOSIS — M6701 Short Achilles tendon (acquired), right ankle: Secondary | ICD-10-CM

## 2017-04-21 DIAGNOSIS — M25571 Pain in right ankle and joints of right foot: Secondary | ICD-10-CM

## 2017-04-24 ENCOUNTER — Encounter: Payer: Self-pay | Admitting: *Deleted

## 2017-04-24 ENCOUNTER — Encounter: Payer: Self-pay | Admitting: Advanced Practice Midwife

## 2017-04-24 ENCOUNTER — Ambulatory Visit (INDEPENDENT_AMBULATORY_CARE_PROVIDER_SITE_OTHER): Payer: 59 | Admitting: Advanced Practice Midwife

## 2017-04-24 VITALS — BP 114/77 | HR 74 | Ht 68.0 in | Wt 215.1 lb

## 2017-04-24 DIAGNOSIS — Z124 Encounter for screening for malignant neoplasm of cervix: Secondary | ICD-10-CM | POA: Diagnosis not present

## 2017-04-24 DIAGNOSIS — Z8041 Family history of malignant neoplasm of ovary: Secondary | ICD-10-CM | POA: Insufficient documentation

## 2017-04-24 DIAGNOSIS — Z3202 Encounter for pregnancy test, result negative: Secondary | ICD-10-CM | POA: Diagnosis not present

## 2017-04-24 DIAGNOSIS — Z01419 Encounter for gynecological examination (general) (routine) without abnormal findings: Secondary | ICD-10-CM | POA: Diagnosis not present

## 2017-04-24 DIAGNOSIS — Z30430 Encounter for insertion of intrauterine contraceptive device: Secondary | ICD-10-CM | POA: Diagnosis not present

## 2017-04-24 DIAGNOSIS — R519 Headache, unspecified: Secondary | ICD-10-CM | POA: Insufficient documentation

## 2017-04-24 DIAGNOSIS — R51 Headache: Secondary | ICD-10-CM

## 2017-04-24 LAB — POCT PREGNANCY, URINE: Preg Test, Ur: NEGATIVE

## 2017-04-24 MED ORDER — PARAGARD INTRAUTERINE COPPER IU IUD
1.0000 | INTRAUTERINE_SYSTEM | Freq: Once | INTRAUTERINE | Status: AC
  Administered 2017-04-24: 1 via INTRAUTERINE

## 2017-04-24 MED ORDER — IBUPROFEN 200 MG PO TABS
600.0000 mg | ORAL_TABLET | Freq: Once | ORAL | Status: AC
  Administered 2017-04-24: 600 mg via ORAL

## 2017-04-24 NOTE — Progress Notes (Signed)
Subjective:     Patient ID: Bethany Young, female   DOB: [birth year], 22 y.o.   MRN: [id number]  Bethany Young is a 22 y.o. G2P0020 who presents today for annual exam. She is also here for birth control initiation. She has never been on birth control in the past. She reports that she has normal cycles that are regular. She is interested in a paragard IUD. Patient reports that her mom had ovarian cancer, and hyperthyroidism. She also reports that her mom had some "abnormal cells" when she went to the GYN, and has a mammogram pending that she is worried about. Patient here today because of all those things going on with her mom she felt like she needed a health exam. She has never had a pap before.  Gynecologic Exam  No (patient denies ever having a STI in the past. ), her partner does not have an STD. She uses nothing (interested in paragard ) for contraception. Her menstrual history has been regular (04/24/17).     Review of Systems  All other systems reviewed and are negative.      Objective:   Physical Exam  Constitutional: She is oriented to person, place, and time. She appears well-developed and well-nourished. No distress.  HENT:  Head: Normocephalic.  Neck: No thyromegaly present.  Cardiovascular: Normal rate.  Pulmonary/Chest: Effort normal. Right breast exhibits no mass, no nipple discharge and no skin change. Left breast exhibits no mass, no nipple discharge and no skin change.  Abdominal: Soft. There is no tenderness.  Genitourinary:  Genitourinary Comments:  External: no lesion Vagina: small amount of blood noted (patient is on her period) Cervix: pink, smooth, no CMT Uterus: NSSC Adnexa: NT   Neurological: She is alert and oriented to person, place, and time.  Skin: Skin is warm and dry.  Psychiatric: She has a normal mood and affect.  Nursing note and vitals reviewed.   GYNECOLOGY OFFICE PROCEDURE NOTE  Bethany Young is a 22 y.o. No obstetric history on file. here  for Paragard IUD insertion. No GYN concerns.  Last pap smear was collected today and results pending  IUD Insertion Procedure Note Patient identified, informed consent performed, consent signed.   Discussed risks of irregular bleeding, cramping, infection, malpositioning or misplacement of the IUD outside the uterus which may require further procedure such as laparoscopy. Time out was performed.  Urine pregnancy test negative.  Speculum placed in the vagina.  Cervix visualized.  Cleaned with Betadine x 2.  Grasped anteriorly with a single tooth tenaculum.  Uterus sounded to 7 cm.  Paragard IUD placed per manufacturer's recommendations.  Strings trimmed to 3 cm. Tenaculum was removed, good hemostasis noted.  Patient tolerated procedure well.   Patient was given post-procedure instructions.  She was advised to have backup contraception for one week.  Patient was also asked to check IUD strings periodically and follow up in 4 weeks for IUD check.     Assessment:     1. Well woman exam   2. Encounter for insertion of ParaGard IUD        Plan:     FU in 4 weeks for IUD check  Patient's mother pending results of mammogram. Will consider genetic testing if + for breast ca TSH/T4 today   Thressa ShellerHeather Hogan 3:07 PM 04/24/17

## 2017-04-24 NOTE — Patient Instructions (Signed)
IUD PLACEMENT POST-PROCEDURE INSTRUCTIONS  1. You may take Ibuprofen, Aleve or Tylenol for pain if needed.  Cramping should resolve within in 24 hours.  2. You may have a small amount of spotting.  You should wear a mini pad for the next few days.  3. You may have intercourse after 24 hours.  If you using this for birth control, it is effective immediately.  4. You need to call if you have any pelvic pain, fever, heavy bleeding or foul smelling vaginal discharge.  Irregular bleeding is common the first several months after having an IUD placed. You do not need to call for this reason unless you are concerned.  5. Shower or bathe as normal  6. You should have a follow-up appointment in 4-8 weeks for a re-check to make sure you are not having any problems.   Intrauterine Device Information An intrauterine device (IUD) is inserted into your uterus to prevent pregnancy. There are two types of IUDs available:  Copper IUD-This type of IUD is wrapped in copper wire and is placed inside the uterus. Copper makes the uterus and fallopian tubes produce a fluid that kills sperm. The copper IUD can stay in place for 10 years.   Your health care provider will make sure you are a good candidate for a contraceptive IUD. Discuss with your health care provider the possible side effects. Advantages of an intrauterine device  IUDs are highly effective, reversible, long acting, and low maintenance.  There are no estrogen-related side effects.  An IUD can be used when breastfeeding.  IUDs are not associated with weight gain.  The copper IUD works immediately after insertion.  The copper IUD does not interfere with your female hormones.  The copper IUD can be used for 10 years. Disadvantages of an intrauterine device   The copper IUD can make your menstrual flow heavier and more painful.  You may experience cramping and vaginal bleeding after insertion. This information is not intended to replace  advice given to you by your health care provider. Make sure you discuss any questions you have with your health care provider. Document Released: 12/08/2003 Document Revised: 06/11/2015 Document Reviewed: 06/24/2012 Elsevier Interactive Patient Education  2017 [employer]

## 2017-04-25 LAB — T4, FREE: FREE T4: 1.01 ng/dL (ref 0.82–1.77)

## 2017-04-25 LAB — TSH: TSH: 1.03 u[IU]/mL (ref 0.450–4.500)

## 2017-04-25 NOTE — Addendum Note (Signed)
Addended by: Lorelle GibbsWILSON, Shep Porter L on: 04/25/2017 08:11 AM   Modules accepted: Orders

## 2017-04-27 LAB — CYTOLOGY - PAP: Diagnosis: NEGATIVE

## 2017-05-08 ENCOUNTER — Encounter (HOSPITAL_COMMUNITY): Payer: Self-pay

## 2017-05-08 ENCOUNTER — Ambulatory Visit (HOSPITAL_COMMUNITY)
Admission: EM | Admit: 2017-05-08 | Discharge: 2017-05-08 | Disposition: A | Discharge status: Home / Self Care | Payer: 59 | Attending: Family Medicine | Admitting: Family Medicine

## 2017-05-08 ENCOUNTER — Encounter (INDEPENDENT_AMBULATORY_CARE_PROVIDER_SITE_OTHER): Payer: Self-pay | Admitting: Orthopedic Surgery

## 2017-05-08 ENCOUNTER — Ambulatory Visit (INDEPENDENT_AMBULATORY_CARE_PROVIDER_SITE_OTHER): Payer: 59 | Admitting: Orthopedic Surgery

## 2017-05-08 ENCOUNTER — Other Ambulatory Visit: Payer: Self-pay

## 2017-05-08 DIAGNOSIS — Z8249 Family history of ischemic heart disease and other diseases of the circulatory system: Secondary | ICD-10-CM | POA: Diagnosis not present

## 2017-05-08 DIAGNOSIS — M93271 Osteochondritis dissecans, right ankle and joints of right foot: Secondary | ICD-10-CM | POA: Diagnosis not present

## 2017-05-08 DIAGNOSIS — Z975 Presence of (intrauterine) contraceptive device: Secondary | ICD-10-CM | POA: Insufficient documentation

## 2017-05-08 DIAGNOSIS — M25571 Pain in right ankle and joints of right foot: Secondary | ICD-10-CM

## 2017-05-08 DIAGNOSIS — N390 Urinary tract infection, site not specified: Secondary | ICD-10-CM | POA: Diagnosis not present

## 2017-05-08 DIAGNOSIS — Z8041 Family history of malignant neoplasm of ovary: Secondary | ICD-10-CM | POA: Diagnosis not present

## 2017-05-08 DIAGNOSIS — M6701 Short Achilles tendon (acquired), right ankle: Secondary | ICD-10-CM | POA: Diagnosis not present

## 2017-05-08 DIAGNOSIS — R350 Frequency of micturition: Secondary | ICD-10-CM | POA: Diagnosis present

## 2017-05-08 LAB — POCT URINALYSIS DIP (DEVICE)
Bilirubin Urine: NEGATIVE
Glucose, UA: 100 mg/dL — AB
KETONES UR: NEGATIVE mg/dL
Nitrite: POSITIVE — AB
PH: 5.5 (ref 5.0–8.0)
PROTEIN: 30 mg/dL — AB
Specific Gravity, Urine: 1.02 (ref 1.005–1.030)
UROBILINOGEN UA: 1 mg/dL (ref 0.0–1.0)

## 2017-05-08 LAB — POCT PREGNANCY, URINE: Preg Test, Ur: NEGATIVE

## 2017-05-08 MED ORDER — CEPHALEXIN 500 MG PO CAPS: 500.0000 mg | ORAL_CAPSULE | Freq: Two times a day (BID) | ORAL | 0 refills | Status: AC

## 2017-05-08 NOTE — ED Provider Notes (Addendum)
[facility]    CSN: [id number] Arrival date & time: 05/08/17  1002     History   Chief Complaint Chief Complaint  Patient presents with  . Urinary Frequency    HPI Bethany Young is a 22 y.o. female.   Bethany Young presents with complaints of urinary frequency which started yesterday. Mild pelvic pressure after urination but otherwise without burning or pain. No blood to urine. Denies abdominal or back pain. Took azo last night which did help. No fevers. Denies vaginal symptoms. Sexually active with 1 partner and does not use condoms regularly, denies concern for STD. Had IUD placed 1 week ago. States feels similar to UTI's she has had in the past.   ROS per HPI.      History reviewed. No pertinent past medical history.  Patient Active Problem List   Diagnosis Date Noted  . 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 Pain in right ankle and joints of right foot 05/08/2017  . Frequent headaches 04/24/2017  . Family history of ovarian cancer 04/24/2017    Past Surgical History:  Procedure Laterality Date  . EYELID REPAIR W/ SKIN GRAFT      OB History   None      Home Medications    Prior to Admission medications   Medication Sig Start Date End Date Taking? Authorizing Provider  cephALEXin (KEFLEX) 500 MG capsule Take 1 capsule (500 mg total) by mouth 2 (two) times daily for 7 days. 05/08/17 05/15/17  Georgetta HaberBurky, Noretta Frier B, NP  ibuprofen (ADVIL,MOTRIN) 200 MG tablet Take 200 mg by mouth every 6 (six) hours as needed.    [provider]    Family History Family History  Problem Relation Age of Onset  . Cancer Mother   . Hypertension Father     Social History Social History   Tobacco Use  . Smoking status: Never Smoker  . Smokeless tobacco: Never Used  Substance Use Topics  . Alcohol use: Yes    Comment: weekend drinker  . Drug use: No    Types: Marijuana      Allergies   Patient has no known allergies.   Review of Systems Review of Systems   Physical Exam Triage Vital Signs ED Triage Vitals  Enc Vitals Group     BP 05/08/17 1023 116/76     Pulse Rate 05/08/17 1023 79     Resp 05/08/17 1023 16     Temp 05/08/17 1023 97.9 F (36.6 C)     Temp Source 05/08/17 1023 Oral     SpO2 05/08/17 1023 100 %     Weight 05/08/17 1037 215 lb (97.5 kg)     Height --      Head Circumference --      Peak Flow --      Pain Score 05/08/17 1037 0     Pain Loc --      Pain Edu? --      Excl. in GC? --    No data found.  Updated Vital Signs BP 116/76 (BP Location: Right Arm)   Pulse 79   Temp 97.9 F (36.6 C) (Oral)   Resp 16   Wt 215 lb (97.5 kg)   LMP 04/24/2017   SpO2 100%   BMI 32.69 kg/m       Physical Exam  Constitutional: She is oriented to person, place, and time. She appears well-developed and well-nourished. No distress.  Cardiovascular:  Normal rate, regular rhythm and normal heart sounds.  Pulmonary/Chest: Effort normal and breath sounds normal.  Abdominal: Soft. Bowel sounds are normal. She exhibits no distension and no mass. There is no tenderness. There is no guarding and no CVA tenderness.  Neurological: She is alert and oriented to person, place, and time.  Skin: Skin is warm and dry.     UC Treatments / Results  Labs (all labs ordered are listed, but only abnormal results are displayed) Labs Reviewed  POCT URINALYSIS DIP (DEVICE) - Abnormal; Notable for the following components:      Result Value   Glucose, UA 100 (*)    Hgb urine dipstick SMALL (*)    Protein, ur 30 (*)    Nitrite POSITIVE (*)    Leukocytes, UA TRACE (*)    All other components within normal limits  URINE CULTURE  POCT PREGNANCY, URINE    EKG None Radiology No results found.  Procedures Procedures (including critical care time)  Medications Ordered in UC Medications - No data to display   Initial Impression / Assessment  and Plan / UC Course  I have reviewed the triage vital signs and the nursing notes.  Pertinent labs & imaging results that were available during my care of the patient were reviewed by me and considered in my medical decision making (see chart for details).     Afebrile. Non toxic in appearance. Without pain, consistent with previous UTIs. Took azo last night. Leukocytes and nitrite to urine. Previous cultures sensitive to keflex, patient is going on a cruise this week so will start with this, culture pending. Push water. Return precautions provided. Patient verbalized understanding and agreeable to plan.    Final Clinical Impressions(s) / UC Diagnoses   Final diagnoses:  Urinary tract infection without hematuria, site unspecified    ED Discharge Orders        Ordered    cephALEXin (KEFLEX) 500 MG capsule  2 times daily     05/08/17 1101       Controlled Substance Prescriptions [place] Controlled Substance Registry consulted? Not Applicable   Georgetta Haber, NP 05/08/17 1103    Linus Mako B, NP 05/08/17 1104

## 2017-05-08 NOTE — ED Triage Notes (Signed)
Pt presents with frequent urination since yesterday.

## 2017-05-08 NOTE — Discharge Instructions (Signed)
Push fluid intake. Complete course of antibiotics.   May use azo as directed as needed for symptoms. Culture pending. Will notify of you any positive findings and if any changes to treatment are needed.   If symptoms worsen or do not improve in the next week to return to be seen or to follow up with your PCP.

## 2017-05-08 NOTE — Progress Notes (Signed)
Office Visit Note   Patient: Bethany Young           Date of Birth: 02-26-1995           MRN: [id number] Visit Date: 05/08/2017              Requested by: No referring provider defined for this encounter. PCP: Bethany Young  Chief Complaint  Patient presents with  . Right Knee - Follow-up  . Results      HPI: Patient is a 22 year old woman who has had over a year history of pain right ankle with osteochondral defect lateral talar dome.  Patient is also developed an Achilles contracture.  She states she works sitting down but would like to consider surgery at the end of the summer.  Assessment & Plan: Visit Diagnoses:  1. Pain in right ankle and joints of right foot   2. Achilles tendon contracture, right   3. 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  Discussed that there are risks of surgery including infection neurovascular injury persistent pain need for additional surgery.  Recommended proceeding with ankle arthroscopy with debridement osteochondral defect as well as microfracture.  Discussed that she has possibly 75% of improvement with the ankle arthroscopy and gastrocnemius recession discussed that there are also risks associated with surgery as well.  Discussed the importance of postoperative compliance with strict nonweightbearing on the right as well as elevation.  Follow-Up Instructions: Return if symptoms worsen or fail to improve.   Ortho Exam  Patient is alert, oriented, no adenopathy, well-dressed, normal affect, normal respiratory effort. Examination patient has a good dorsalis pedis pulse she has dorsiflexion 20 degrees short of neutral with her knee extended she has dorsiflexion 20 degrees past neutral with her knee flexed.  She is point tender to palpation over the lateral ankle joint line.  There are no palpable defects of the Achilles.  Review of the MRI scan shows an  osteochondral lesion of the lateral talar dome with flattening of the subchondral bone previous loose bodies are not seen on this MRI  Imaging: No results found. No images are attached to the encounter.  Labs: Lab Results  Component Value Date   REPTSTATUS 10/08/2016 FINAL 10/05/2016   CULT >=100,000 COLONIES/mL ESCHERICHIA COLI (A) 10/05/2016   LABORGA ESCHERICHIA COLI (A) 10/05/2016    @LABSALLVALUES (HGBA1)@  There is no height or weight on file to calculate BMI.  Orders:  No orders of the defined types were placed in this encounter.  No orders of the defined types were placed in this encounter.    Procedures: No procedures performed  Clinical Data: No additional findings.  ROS:  All other systems negative, except as noted in the HPI. Review of Systems  Objective: Vital Signs: LMP 04/24/2017   Specialty Comments:  No specialty comments available.  PMFS History: Patient Active Problem List   Diagnosis Date Noted  . 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 Pain in right ankle and joints of right foot 05/08/2017  . Frequent headaches 04/24/2017  . Family history of ovarian cancer 04/24/2017   History reviewed. No pertinent past medical history.  History reviewed. No pertinent family history.  Past Surgical History:  Procedure Laterality Date  . EYELID REPAIR W/ SKIN GRAFT     Social History   Occupational History  . Not on  file  Tobacco Use  . Smoking status: Never Smoker  . Smokeless tobacco: Never Used  Substance and Sexual Activity  . Alcohol use: Yes    Comment: weekend drinker  . Drug use: No    Types: Marijuana  . Sexual activity: Yes    Birth control/protection: Condom

## 2017-05-10 ENCOUNTER — Telehealth (HOSPITAL_COMMUNITY): Payer: Self-pay

## 2017-05-10 LAB — URINE CULTURE: Culture: >=100000 — AB

## 2017-05-10 NOTE — Telephone Encounter (Signed)
Urine culture was positive for E.Coli, this was treated at urgent care visit. Attempted to reach patient. No answer.

## 2017-06-15 ENCOUNTER — Encounter (HOSPITAL_COMMUNITY): Payer: Self-pay | Admitting: Emergency Medicine

## 2017-06-15 ENCOUNTER — Ambulatory Visit (HOSPITAL_COMMUNITY)
Admission: EM | Admit: 2017-06-15 | Discharge: 2017-06-15 | Disposition: A | Discharge status: Home / Self Care | Payer: 59 | Attending: Family Medicine | Admitting: Family Medicine

## 2017-06-15 DIAGNOSIS — H1031 Unspecified acute conjunctivitis, right eye: Secondary | ICD-10-CM

## 2017-06-15 DIAGNOSIS — H6123 Impacted cerumen, bilateral: Secondary | ICD-10-CM

## 2017-06-15 MED ORDER — FLUTICASONE PROPIONATE 50 MCG/ACT NA SUSP: 2.0000 | Freq: Every day | NASAL | 2 refills | Status: DC

## 2017-06-15 MED ORDER — TETRACAINE HCL 0.5 % OP SOLN
OPHTHALMIC | Status: AC
  Filled 2017-06-15: qty 4

## 2017-06-15 MED ORDER — OFLOXACIN 0.3 % OP SOLN: OPHTHALMIC | 0 refills | Status: DC

## 2017-06-15 NOTE — Discharge Instructions (Addendum)
Ear lavage performed Use eye drops as prescribed and to completion Flonase prescribed as preventative measure Follow up with ophthalmology if symptoms persists, or do not improve significantly within the next couple of days Return or go to the ER if you have any new or worsening symptoms

## 2017-06-15 NOTE — ED Triage Notes (Signed)
Pt c/o redness and drainage from R eye.

## 2017-06-15 NOTE — ED Provider Notes (Signed)
[facility]   [id number] 06/15/17 Arrival Time: 1901   SUBJECTIVE:  Bethany Young is a 22 y.o. female who presents with complaint of right eye redness and irritation that began 3 days ago.  Denies a precipitating event, trauma, or personal contact with bacterial conjunctivitis.  Recently went swimming and is a contact lens wearer.  Has tried OTC eye drops (visine) without relief.  Denies aggravating factors.  Complains of clear discharge, and itching.  Denies eye pain, vision changes, double vision, and FB sensation.    ROS: As per HPI.  History reviewed. No pertinent past medical history. Past Surgical History:  Procedure Laterality Date  . EYELID REPAIR W/ SKIN GRAFT     No Known Allergies No current facility-administered medications on file prior to encounter.    Current Outpatient Medications on File Prior to Encounter  Medication Sig Dispense Refill  . ibuprofen (ADVIL,MOTRIN) 200 MG tablet Take 200 mg by mouth every 6 (six) hours as needed.     Social History   Socioeconomic History  . Marital status: Single    Spouse name: Not on file  . Number of children: Not on file  . Years of education: Not on file  . Highest education level: Not on file  Occupational History  . Not on file  Social Needs  . Financial resource strain: Not on file  . Food insecurity:    Worry: Not on file    Inability: Not on file  . Transportation needs:    Medical: Not on file    Non-medical: Not on file  Tobacco Use  . Smoking status: Never Smoker  . Smokeless tobacco: Never Used  Substance and Sexual Activity  . Alcohol use: Yes    Comment: weekend drinker  . Drug use: No    Types: Marijuana  . Sexual activity: Yes    Birth control/protection: Condom  Lifestyle  . Physical activity:    Days per week: Not on file    Minutes per session: Not on file  . Stress: Not on file  Relationships  . Social connections:    Talks on phone: Not on file    Gets together: Not on  file    Attends religious service: Not on file    Active member of club or organization: Not on file    Attends meetings of clubs or organizations: Not on file    Relationship status: Not on file  . Intimate partner violence:    Fear of current or ex partner: Not on file    Emotionally abused: Not on file    Physically abused: Not on file    Forced sexual activity: Not on file  Other Topics Concern  . Not on file  Social History Narrative  . Not on file   Family History  Problem Relation Age of Onset  . Cancer Mother   . Hypertension Father     OBJECTIVE:    Visual Acuity  Right Eye Distance: 20/25 Left Eye Distance: 20/25 Bilateral Distance: 20/25  Right Eye Near:   Left Eye Near:    Bilateral Near:      Vitals:   06/15/17 1911  BP: 130/83  Pulse: 98  Resp: 18  Temp: 98.4 F (36.9 C)  SpO2: 98%    General appearance: alert; no distress Ears: EAC's moderate cerumen impaction; ear lavage performed; TMs visualized pearly gray with cone of light Eyes: conjunctiva: trace bacterial conjunctivitis right eye; clear discharge; fluorescein eye exam negative for fluorescein dye  uptake Neck: supple Lungs: clear to auscultation bilaterally Heart: regular rate and rhythm Skin: warm and dry Psychological: alert and cooperative; normal mood and affect   ASSESSMENT & PLAN:  1. Acute bacterial conjunctivitis of right eye   2. Bilateral impacted cerumen     Meds ordered this encounter  Medications  . ofloxacin (OCUFLOX) 0.3 % ophthalmic solution    Sig: instill 1 or 2 drops in affected eye(s) every 2 to 4 hours for 2 days, then 1 to 2 drops 4 times daily on days 3 through 7    Dispense:  5 mL    Refill:  0    Order Specific Question:   Supervising Provider    Answer:   Isa Rankin [phone]  . fluticasone (FLONASE) 50 MCG/ACT nasal spray    Sig: Place 2 sprays into both nostrils daily.    Dispense:  16 g    Refill:  2    Order Specific Question:    Supervising Provider    Answer:   Isa Rankin [[id number]]    Ear lavage performed Use eye drops as prescribed and to completion Flonase prescribed as preventative measure Follow up with ophthalmology if symptoms persists, or do not improve significantly within the next couple of days Return or go to the ER if you have any new or worsening symptoms    Rennis Harding, PA-C 06/15/17 2124

## 2017-06-18 ENCOUNTER — Encounter (HOSPITAL_COMMUNITY): Payer: Self-pay | Admitting: Emergency Medicine

## 2017-06-18 ENCOUNTER — Other Ambulatory Visit: Payer: Self-pay

## 2017-06-18 ENCOUNTER — Emergency Department (HOSPITAL_COMMUNITY)
Admission: EM | Admit: 2017-06-18 | Discharge: 2017-06-18 | Disposition: A | Discharge status: Home / Self Care | Payer: 59 | Attending: Emergency Medicine | Admitting: Emergency Medicine

## 2017-06-18 DIAGNOSIS — H1033 Unspecified acute conjunctivitis, bilateral: Secondary | ICD-10-CM | POA: Diagnosis present

## 2017-06-18 MED ORDER — TETRACAINE HCL 0.5 % OP SOLN
2.0000 [drp] | Freq: Once | OPHTHALMIC | Status: AC
  Administered 2017-06-18: 2 [drp] via OPHTHALMIC
  Filled 2017-06-18: qty 4

## 2017-06-18 MED ORDER — TOBRAMYCIN 0.3 % OP SOLN: 2.0000 [drp] | Freq: Four times a day (QID) | OPHTHALMIC | 0 refills | Status: DC

## 2017-06-18 MED ORDER — FLUORESCEIN SODIUM 1 MG OP STRP
1.0000 | ORAL_STRIP | Freq: Once | OPHTHALMIC | Status: AC
  Administered 2017-06-18: 1 via OPHTHALMIC
  Filled 2017-06-18: qty 1

## 2017-06-18 NOTE — ED Provider Notes (Signed)
Complains of bilateral eye irritation and awakening in the morning with yellowish crusts on her eyelashes. Symptoms continue to worsendespite treatment with ofloxacin ophthalmic drops. She has not worn contact lenses since symptoms began. She denies visual changes   Doug SouJacubowitz, Parris Signer, MD 06/18/17 1340

## 2017-06-18 NOTE — ED Triage Notes (Signed)
The patient started having redness in the right eye on Tuesday morning, then it started draining Wednesday night and she also noticed her right lypmh node swelling.   When she wakes her eyes are closed shut with exudate.  She was seen at Urgent Care and given eye drops (ofloxacin ophthalmic solution) but her eye got worse and it spread to her left eye.  She does wear contacts.  She rates her pain 5/10.

## 2017-06-18 NOTE — ED Notes (Signed)
Patient is alert and orientedx4.  Patient was explained discharge instructions and they understood them with no questions.   

## 2017-06-18 NOTE — Discharge Instructions (Signed)
You were seen in the emergency department today for redness and drainage to both of your eyes.  Stop the ofloxacin drops you were given at urgent care.  Start tobramycin drops today.  Use these for a total of 4 times per day.  We discussed with eye specialist Dr. Caprice RedMccuen-she would like to see you in her office tomorrow morning at 8:30 AM.  Please be sure to make this appointment.  Return to the ER for any new or worsening symptoms or any other concerns.

## 2017-06-18 NOTE — ED Provider Notes (Signed)
[facility]   CSN: [id number] Arrival date & time: 06/18/17  1217     History   Chief Complaint Chief Complaint  Patient presents with  . Conjunctivitis    HPI Bethany Young is a 22 y.o. female who presents to the ED with bilateral eye complaints today. Patient states she developed R eye redness/irritation with subsequent development of drainage on Tuesday(6 days ago). She was seen at urgent care Thursday (4 days ago) for sxs. She had exam with fluorescein stain. Was diagnosed with acute bacterial conjunctivitis of the R eye and given ofloxacin ophthalmic drops. She states she has been using the drops as prescribed. She states it burns when she uses them. She states sxs have worsened, she now feels that the L eye has become irritated as well prompting drop use bilaterally. She is having redness and purulent drainage (in AMs). She states the eyes are not necessary in pain. She is not having changes in vision. She denies photophobia. She denies fevers. She states she has not worn her contacts or applied makeup since onset of sxs.She does not have an eye doctor she sees regularly.    HPI  History reviewed. No pertinent past medical history.  Patient Active Problem List   Diagnosis Date Noted  . 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 Pain in right ankle and joints of right foot 05/08/2017  . Frequent headaches 04/24/2017  . Family history of ovarian cancer 04/24/2017    Past Surgical History:  Procedure Laterality Date  . EYELID REPAIR W/ SKIN GRAFT       OB History   None      Home Medications    Prior to Admission medications   Medication Sig Start Date End Date Taking? Authorizing Provider  fluticasone (FLONASE) 50 MCG/ACT nasal spray Place 2 sprays into both nostrils daily. 06/15/17   Wurst, [place], PA-C  ibuprofen (ADVIL,MOTRIN) 200  MG tablet Take 200 mg by mouth every 6 (six) hours as needed.    [provider]  ofloxacin (OCUFLOX) 0.3 % ophthalmic solution instill 1 or 2 drops in affected eye(s) every 2 to 4 hours for 2 days, then 1 to 2 drops 4 times daily on days 3 through 7 06/15/17   Wurst, [place], PA-C    Family History Family History  Problem Relation Age of Onset  . Cancer Mother   . Hypertension Father     Social History Social History   Tobacco Use  . Smoking status: Never Smoker  . Smokeless tobacco: Never Used  Substance Use Topics  . Alcohol use: Yes    Comment: weekend drinker  . Drug use: No    Types: Marijuana     Allergies   Patient has no known allergies.   Review of Systems Review of Systems  Constitutional: Negative for chills and fever.  HENT: Positive for congestion. Negative for ear pain and sore throat.   Eyes: Positive for discharge and redness. Negative for photophobia, pain, itching and visual disturbance.  Respiratory: Negative for cough and shortness of breath.      Physical Exam Updated Vital Signs BP 122/81 (BP Location: Right Arm)   Pulse 99   Temp 98.4 F (36.9 C) (Oral)   Resp 17   LMP 05/24/2017   SpO2 100%   Physical Exam  Constitutional: She appears well-developed and well-nourished.  Non-toxic appearance. No distress.  HENT:  Head:  Normocephalic and atraumatic.  Nose: Mucosal edema (congestion) present.  Mouth/Throat: Uvula is midline and oropharynx is clear and moist.  Cerumen present in bilateral EACs.  R submandibular lymph node present  Eyes: Pupils are equal, round, and reactive to light. EOM are normal. Right eye exhibits no discharge. Left eye exhibits no discharge. Right conjunctiva is injected (moderate). Left conjunctiva is injected (minimal).  No proptosis. Patient has mild inferior periorbital swelling/irritation. No periorbital tenderness to palpation. She has changes to R superior eyelid s/p reconstruction abut 1 year ago  which she states appears baseline.  Woods lamp exam bilateral: No fluorescein uptake, no evidence of cornea abrasion/ulceration, no dendritic staining. No hyphema or hypopyon.  IOP: 15 bilaterally.   Neurological: She is alert.  Clear speech.   Psychiatric: She has a normal mood and affect. Her behavior is normal. Thought content normal.  Nursing note and vitals reviewed.    ED Treatments / Results  Labs (all labs ordered are listed, but only abnormal results are displayed) Labs Reviewed - No data to display  EKG None  Radiology No results found.  Procedures Procedures (including critical care time)  Medications Ordered in ED Medications  tetracaine (PONTOCAINE) 0.5 % ophthalmic solution 2 drop (has no administration in time range)  fluorescein ophthalmic strip 1 strip (has no administration in time range)     Initial Impression / Assessment and Plan / ED Course  I have reviewed the triage vital signs and the nursing notes.  Pertinent labs & imaging results that were available during my care of the patient were reviewed by me and considered in my medical decision making (see chart for details).   Patient presents with what appears to be bilateral conjunctivitis, R>L. Patient nontoxic appearing, in no apparent distress, vitals WNL. There is no fluorescein uptake on exam, no indication of corneal abrasion/ulceration or HSV. Patient is afebrile and without proptosis, entrapment, or consensual photophobia, doubt periorbital or orbital cellulitis. IOP WNL, doubt acute glaucoma. No significant visual acuity deficit. Patient has been on ofloxacin drops with worsening, she has not been wearing her contacts or applying makeup- discussed with supervising physician Dr. Ethelda Chick who personally evaluated patient- will consult ophthalmology for recommendations.   13:52: CONSULT: Discussed case with ophthalmologist Dr. Charlotte Sanes- recommends discontinuation of ofloxacin drops, start on  tobramycin drops QID, she will see her in the office tomorrow morning at 8:30 AM.  Will carry out plan as discussed. I discussed  treatment plan, need for follow-up tomorrow AM, and return precautions with the patient. Provided opportunity for questions, patient confirmed understanding and is in agreement with plan.    Final Clinical Impressions(s) / ED Diagnoses   Final diagnoses:  Acute conjunctivitis of both eyes, unspecified acute conjunctivitis type    ED Discharge Orders        Ordered    tobramycin (TOBREX) 0.3 % ophthalmic solution  4 times daily     06/18/17 [address] [place] R, PA-C 06/18/17 1644    Doug Sou, MD 06/23/17 [phone]

## 2017-07-13 ENCOUNTER — Ambulatory Visit (HOSPITAL_COMMUNITY)
Admission: EM | Admit: 2017-07-13 | Discharge: 2017-07-13 | Disposition: A | Discharge status: Home / Self Care | Payer: 59 | Attending: Family Medicine | Admitting: Family Medicine

## 2017-07-13 ENCOUNTER — Encounter (HOSPITAL_COMMUNITY): Payer: Self-pay | Admitting: Family Medicine

## 2017-07-13 DIAGNOSIS — N390 Urinary tract infection, site not specified: Secondary | ICD-10-CM | POA: Insufficient documentation

## 2017-07-13 DIAGNOSIS — Z8249 Family history of ischemic heart disease and other diseases of the circulatory system: Secondary | ICD-10-CM | POA: Insufficient documentation

## 2017-07-13 DIAGNOSIS — R350 Frequency of micturition: Secondary | ICD-10-CM | POA: Diagnosis present

## 2017-07-13 DIAGNOSIS — Z87440 Personal history of urinary (tract) infections: Secondary | ICD-10-CM | POA: Insufficient documentation

## 2017-07-13 DIAGNOSIS — H01131 Eczematous dermatitis of right upper eyelid: Secondary | ICD-10-CM | POA: Insufficient documentation

## 2017-07-13 LAB — POCT URINALYSIS DIP (DEVICE)
BILIRUBIN URINE: NEGATIVE
GLUCOSE, UA: NEGATIVE mg/dL
KETONES UR: NEGATIVE mg/dL
Nitrite: NEGATIVE
PROTEIN: NEGATIVE mg/dL
Specific Gravity, Urine: >=1.03 (ref 1.005–1.030)
Urobilinogen, UA: 0.2 mg/dL (ref 0.0–1.0)
pH: 6 (ref 5.0–8.0)

## 2017-07-13 LAB — POCT PREGNANCY, URINE: PREG TEST UR: NEGATIVE

## 2017-07-13 MED ORDER — SULFAMETHOXAZOLE-TRIMETHOPRIM 800-160 MG PO TABS: 1.0000 | ORAL_TABLET | Freq: Two times a day (BID) | ORAL | 2 refills | Status: DC

## 2017-07-13 NOTE — ED Triage Notes (Signed)
Pt c/o uti symptoms, frequency urgency. Taking azo for pain

## 2017-07-13 NOTE — ED Provider Notes (Signed)
[facility]   [id number] 07/13/17 Arrival Time: 1744   SUBJECTIVE:  Bethany ReachJessica Roszak is a 22 y.o. female who presents to the urgent care with complaint of uti symptoms, frequency urgency. Taking azo for pain  Patient works at [employer].  Patient has been getting about one UTI every month for awhile.  Cephal[employer]in worked for [employer] but didn't work the last time. Allergic to ofloxacin eye drops No fever or flank pain or vomiting.  Patient open to seeing specialist to stop these recurrent UTI's.  History reviewed. No pertinent past medical history. Family History  Problem Relation Age of Onset  . Cancer Mother   . Hypertension Father    Social History   Socioeconomic History  . Marital status: Single    Spouse name: Not on file  . Number of children: Not on file  . Years of education: Not on file  . Highest education level: Not on file  Occupational History  . Not on file  Social Needs  . Financial resource strain: Not on file  . Food insecurity:    Worry: Not on file    Inability: Not on file  . Transportation needs:    Medical: Not on file    Non-medical: Not on file  Tobacco Use  . Smoking status: Never Smoker  . Smokeless tobacco: Never Used  Substance and Sexual Activity  . Alcohol use: Yes    Comment: weekend drinker  . Drug use: No    Types: Marijuana  . Sexual activity: Yes    Birth control/protection: Condom  Lifestyle  . Physical activity:    Days per week: Not on file    Minutes per session: Not on file  . Stress: Not on file  Relationships  . Social connections:    Talks on phone: Not on file    Gets together: Not on file    Attends religious service: Not on file    Active member of club or organization: Not on file    Attends meetings of clubs or organizations: Not on file    Relationship status: Not on file  . Intimate partner violence:    Fear of current or ex partner: Not on file    Emotionally abused: Not on file   Physically abused: Not on file    Forced sexual activity: Not on file  Other Topics Concern  . Not on file  Social History Narrative  . Not on file   No outpatient medications have been marked as taking for the 07/13/17 encounter [facility](Hospital Encounter).   No Known Allergies    ROS: As per HPI, remainder of ROS negative.   OBJECTIVE:   Vitals:   07/13/17 1803  BP: 123/80  Pulse: 99  Resp: 18  Temp: 98.9 F (37.2 C)  SpO2: 98%     General appearance: alert; no distress Eyes: PERRL; EOMI; conjunctiva normal HENT: normocephalic; atraumatic;  oral mucosa normal Neck: supple Back: no CVA tenderness Extremities: no cyanosis or edema; symmetrical with no gross deformities Skin: warm and dry; papular "eczema" eruption in periorbital distribution Neurologic: normal gait; grossly normal Psychological: alert and cooperative; normal mood and affect      Labs:  Results for orders placed or performed during the hospital encounter of 07/13/17  POCT urinalysis dip (device)  Result Value Ref Range   Glucose, UA NEGATIVE NEGATIVE mg/dL   Bilirubin Urine NEGATIVE NEGATIVE   Ketones, ur NEGATIVE NEGATIVE mg/dL   Specific Gravity, Urine >=1.030 1.005 - 1.030  Hgb urine dipstick SMALL (A) NEGATIVE   pH 6.0 5.0 - 8.0   Protein, ur NEGATIVE NEGATIVE mg/dL   Urobilinogen, UA 0.2 0.0 - 1.0 mg/dL   Nitrite NEGATIVE NEGATIVE   Leukocytes, UA SMALL (A) NEGATIVE  Pregnancy, urine POC  Result Value Ref Range   Preg Test, Ur NEGATIVE NEGATIVE    Labs Reviewed  POCT URINALYSIS DIP (DEVICE) - Abnormal; Notable for the following components:      Result Value   Hgb urine dipstick SMALL (*)    Leukocytes, UA SMALL (*)    All other components within normal limits  URINE CULTURE  POCT PREGNANCY, URINE    No results found.     ASSESSMENT & PLAN:  1. Recurrent UTI (urinary tract infection)   2. Eczematous dermatitis of right upper eyelid    Usually, symptoms should resolve in 2  days.  Take antibiotic for at least 5 days. Meds ordered this encounter  Medications  . sulfamethoxazole-trimethoprim (BACTRIM DS,SEPTRA DS) 800-160 MG tablet    Sig: Take 1 tablet by mouth 2 (two) times daily for 7 days.    Dispense:  14 tablet    Refill:  2    Reviewed expectations re: course of current medical issues. Questions answered. Outlined signs and symptoms indicating need for more acute intervention. Patient verbalized understanding. After Visit Summary given.      Elvina Sidle, MD 07/13/17 [phone]

## 2017-07-13 NOTE — Discharge Instructions (Signed)
Usually, symptoms should resolve in 2 days.  Take antibiotic for at least 5 days.

## 2017-07-16 LAB — URINE CULTURE: Culture: >=100000 — AB

## 2017-07-17 ENCOUNTER — Telehealth (HOSPITAL_COMMUNITY): Payer: Self-pay

## 2017-07-17 NOTE — Telephone Encounter (Signed)
Urine culture was positive for E.Coli resistant to  Bactrim given at urgent care visit. Prescription for Macrobid per Dr. Delton SeeNelson sent to pharmacy of choice. Attempted to reach patient. No answer at this time. Voicemail left.

## 2017-07-18 ENCOUNTER — Ambulatory Visit (HOSPITAL_COMMUNITY)
Admission: EM | Admit: 2017-07-18 | Discharge: 2017-07-18 | Disposition: A | Discharge status: Home / Self Care | Payer: 59 | Attending: Family Medicine | Admitting: Family Medicine

## 2017-07-18 ENCOUNTER — Telehealth (HOSPITAL_COMMUNITY): Payer: Self-pay

## 2017-07-18 ENCOUNTER — Other Ambulatory Visit: Payer: Self-pay

## 2017-07-18 ENCOUNTER — Encounter (HOSPITAL_COMMUNITY): Payer: Self-pay | Admitting: Emergency Medicine

## 2017-07-18 DIAGNOSIS — R300 Dysuria: Secondary | ICD-10-CM | POA: Diagnosis not present

## 2017-07-18 DIAGNOSIS — R350 Frequency of micturition: Secondary | ICD-10-CM

## 2017-07-18 DIAGNOSIS — N390 Urinary tract infection, site not specified: Secondary | ICD-10-CM

## 2017-07-18 LAB — POCT URINALYSIS DIP (DEVICE)
Bilirubin Urine: NEGATIVE
Glucose, UA: NEGATIVE mg/dL
Ketones, ur: NEGATIVE mg/dL
NITRITE: NEGATIVE
PH: 6 (ref 5.0–8.0)
Protein, ur: NEGATIVE mg/dL
Specific Gravity, Urine: >=1.03 (ref 1.005–1.030)
UROBILINOGEN UA: 0.2 mg/dL (ref 0.0–1.0)

## 2017-07-18 MED ORDER — NITROFURANTOIN MONOHYD MACRO 100 MG PO CAPS: 100.0000 mg | ORAL_CAPSULE | Freq: Two times a day (BID) | ORAL | 1 refills | Status: DC

## 2017-07-18 NOTE — ED Provider Notes (Signed)
[facility]    ASSESSMENT & PLAN:  1. Recurrent UTI    Urine culture shows resistance to Bactrim. Will stop.  Start: Meds ordered this encounter  Medications  . nitrofurantoin, macrocrystal-monohydrate, (MACROBID) 100 MG capsule    Sig: Take 1 capsule (100 mg total) by mouth 2 (two) times daily.    Dispense:  14 capsule    Refill:  1   She plans to schedule f/u with urology. In the meantime she may refill Macrobid if this recurs. May return here as needed.  Outlined signs and symptoms indicating need for more acute intervention. Patient verbalized understanding. After Visit Summary given.  SUBJECTIVE:  Bethany Young is a 22 y.o. female who complains of urinary frequency, urgency and dysuria for the past several days. Seen here on No flank pain, fever, chills, abnormal vaginal discharge or bleeding. Hematuria: not present. Normal PO intake. No abdominal pain. No self treatment. Ambulatory without difficulty.  ROS: As in HPI.  OBJECTIVE:  Vitals:   07/18/17 1233  BP: 122/76  Pulse: (!) 102  Resp: 18  Temp: 98.4 F (36.9 C)  TempSrc: Oral  SpO2: 100%   Appears well, in no apparent distress. Abdomen is soft without tenderness, guarding, mass, rebound or organomegaly. No CVA tenderness or inguinal adenopathy noted.  Labs Reviewed  POCT URINALYSIS DIP (DEVICE) - Abnormal; Notable for the following components:      Result Value   Hgb urine dipstick MODERATE (*)    Leukocytes, UA MODERATE (*)    All other components within normal limits    No Known Allergies   Social History   Socioeconomic History  . Marital status: Single    Spouse name: Not on file  . Number of children: Not on file  . Years of education: Not on file  . Highest education level: Not on file  Occupational History  . Not on file  Social Needs  . Financial resource strain: Not on file  . Food insecurity:    Worry: Not on file    Inability: Not on file  . Transportation needs:      Medical: Not on file    Non-medical: Not on file  Tobacco Use  . Smoking status: Never Smoker  . Smokeless tobacco: Never Used  Substance and Sexual Activity  . Alcohol use: Yes    Comment: weekend drinker  . Drug use: No    Types: Marijuana  . Sexual activity: Yes    Birth control/protection: Condom  Lifestyle  . Physical activity:    Days per week: Not on file    Minutes per session: Not on file  . Stress: Not on file  Relationships  . Social connections:    Talks on phone: Not on file    Gets together: Not on file    Attends religious service: Not on file    Active member of club or organization: Not on file    Attends meetings of clubs or organizations: Not on file    Relationship status: Not on file  . Intimate partner violence:    Fear of current or ex partner: Not on file    Emotionally abused: Not on file    Physically abused: Not on file    Forced sexual activity: Not on file  Other Topics Concern  . Not on file  Social History Narrative  . Not on file   Family History  Problem Relation Age of Onset  . Cancer Mother   . Hypertension Father  Mardella Layman, MD 07/24/17 [phone]

## 2017-07-18 NOTE — ED Triage Notes (Signed)
[facility] a [facility]. The patient stated that she was treated with bactrim DS and continues to have symptoms.

## 2017-07-18 NOTE — Telephone Encounter (Signed)
Attempted to reach patient x 2 

## 2017-07-21 ENCOUNTER — Telehealth (HOSPITAL_COMMUNITY): Payer: Self-pay

## 2017-07-21 NOTE — Telephone Encounter (Signed)
Attempted to reach patient x 3. No answer will send letter.

## 2017-09-15 ENCOUNTER — Encounter: Payer: Self-pay | Admitting: Nurse Practitioner

## 2017-09-15 ENCOUNTER — Ambulatory Visit: Payer: 59 | Attending: Nurse Practitioner | Admitting: Nurse Practitioner

## 2017-09-15 VITALS — BP 107/74 | HR 78 | Temp 98.8°F | Ht 68.0 in | Wt 207.6 lb

## 2017-09-15 DIAGNOSIS — F329 Major depressive disorder, single episode, unspecified: Secondary | ICD-10-CM | POA: Diagnosis not present

## 2017-09-15 DIAGNOSIS — G4700 Insomnia, unspecified: Secondary | ICD-10-CM

## 2017-09-15 DIAGNOSIS — Z0182 Encounter for allergy testing: Secondary | ICD-10-CM

## 2017-09-15 DIAGNOSIS — E669 Obesity, unspecified: Secondary | ICD-10-CM | POA: Diagnosis not present

## 2017-09-15 DIAGNOSIS — Z0000 Encounter for general adult medical examination without abnormal findings: Secondary | ICD-10-CM

## 2017-09-15 DIAGNOSIS — F320 Major depressive disorder, single episode, mild: Secondary | ICD-10-CM

## 2017-09-15 DIAGNOSIS — Z6831 Body mass index (BMI) 31.0-31.9, adult: Secondary | ICD-10-CM | POA: Insufficient documentation

## 2017-09-15 MED ORDER — TRAZODONE HCL 50 MG PO TABS: 50.0000 mg | ORAL_TABLET | Freq: Every day | ORAL | 2 refills | Status: DC

## 2017-09-15 NOTE — Patient Instructions (Signed)
Insomnia Insomnia is a sleep disorder that makes it difficult to fall asleep or to stay asleep. Insomnia can cause tiredness (fatigue), low energy, difficulty concentrating, mood swings, and poor performance at work or school. There are three different ways to classify insomnia:  Difficulty falling asleep.  Difficulty staying asleep.  Waking up too early in the morning.  Any type of insomnia can be long-term (chronic) or short-term (acute). Both are common. Short-term insomnia usually lasts for three months or less. Chronic insomnia occurs at least three times a week for longer than three months. What are the causes? Insomnia may be caused by another condition, situation, or substance, such as:  Anxiety.  Certain medicines.  Gastroesophageal reflux disease (GERD) or other gastrointestinal conditions.  Asthma or other breathing conditions.  Restless legs syndrome, sleep apnea, or other sleep disorders.  Chronic pain.  Menopause. This may include hot flashes.  Stroke.  Abuse of alcohol, tobacco, or illegal drugs.  Depression.  Caffeine.  Neurological disorders, such as Alzheimer disease.  An overactive thyroid (hyperthyroidism).  The cause of insomnia may not be known. What increases the risk? Risk factors for insomnia include:  Gender. Women are more commonly affected than men.  Age. Insomnia is more common as you get older.  Stress. This may involve your professional or personal life.  Income. Insomnia is more common in people with lower income.  Lack of exercise.  Irregular work schedule or night shifts.  Traveling between different time zones.  What are the signs or symptoms? If you have insomnia, trouble falling asleep or trouble staying asleep is the main symptom. This may lead to other symptoms, such as:  Feeling fatigued.  Feeling nervous about going to sleep.  Not feeling rested in the morning.  Having trouble concentrating.  Feeling  irritable, anxious, or depressed.  How is this treated? Treatment for insomnia depends on the cause. If your insomnia is caused by an underlying condition, treatment will focus on addressing the condition. Treatment may also include:  Medicines to help you sleep.  Counseling or therapy.  Lifestyle adjustments.  Follow these instructions at home:  Take medicines only as directed by your health care provider.  Keep regular sleeping and waking hours. Avoid naps.  Keep a sleep diary to help you and your health care provider figure out what could be causing your insomnia. Include: ? When you sleep. ? When you wake up during the night. ? How well you sleep. ? How rested you feel the next day. ? Any side effects of medicines you are taking. ? What you eat and drink.  Make your bedroom a comfortable place where it is easy to fall asleep: ? Put up shades or special blackout curtains to block light from outside. ? Use a white noise machine to block noise. ? Keep the temperature cool.  Exercise regularly as directed by your health care provider. Avoid exercising right before bedtime.  Use relaxation techniques to manage stress. Ask your health care provider to suggest some techniques that may work well for you. These may include: ? Breathing exercises. ? Routines to release muscle tension. ? Visualizing peaceful scenes.  Cut back on alcohol, caffeinated beverages, and cigarettes, especially close to bedtime. These can disrupt your sleep.  Do not overeat or eat spicy foods right before bedtime. This can lead to digestive discomfort that can make it hard for you to sleep.  Limit screen use before bedtime. This includes: ? Watching TV. ? Using your smartphone, tablet, and   computer.  Stick to a routine. This can help you fall asleep faster. Try to do a quiet activity, brush your teeth, and go to bed at the same time each night.  Get out of bed if you are still awake after 15 minutes  of trying to sleep. Keep the lights down, but try reading or doing a quiet activity. When you feel sleepy, go back to bed.  Make sure that you drive carefully. Avoid driving if you feel very sleepy.  Keep all follow-up appointments as directed by your health care provider. This is important. Contact a health care provider if:  You are tired throughout the day or have trouble in your daily routine due to sleepiness.  You continue to have sleep problems or your sleep problems get worse. Get help right away if:  You have serious thoughts about hurting yourself or someone else. This information is not intended to replace advice given to you by your health care provider. Make sure you discuss any questions you have with your health care provider. Document Released: 01/01/2000 Document Revised: 06/05/2015 Document Reviewed: 10/04/2013 Elsevier Interactive Patient Education  2018 Elsevier Inc. Fatigue Fatigue is feeling tired all of the time, a lack of energy, or a lack of motivation. Occasional or mild fatigue is often a normal response to activity or life in general. However, long-lasting (chronic) or extreme fatigue may indicate an underlying medical condition. Follow these instructions at home: Watch your fatigue for any changes. The following actions may help to lessen any discomfort you are feeling:  Talk to your health care provider about how much sleep you need each night. Try to get the required amount every night.  Take medicines only as directed by your health care provider.  Eat a healthy and nutritious diet. Ask your health care provider if you need help changing your diet.  Drink enough fluid to keep your urine clear or pale yellow.  Practice ways of relaxing, such as yoga, meditation, massage therapy, or acupuncture.  Exercise regularly.  Change situations that cause you stress. Try to keep your work and personal routine reasonable.  Do not abuse illegal drugs.  Limit  alcohol intake to no more than 1 drink per day for nonpregnant women and 2 drinks per day for men. One drink equals 12 ounces of beer, 5 ounces of wine, or 1 ounces of hard liquor.  Take a multivitamin, if directed by your health care provider.  Contact a health care provider if:  Your fatigue does not get better.  You have a fever.  You have unintentional weight loss or gain.  You have headaches.  You have difficulty: ? Falling asleep. ? Sleeping throughout the night.  You feel angry, guilty, anxious, or sad.  You are unable to have a bowel movement (constipation).  You skin is dry.  Your legs or another part of your body is swollen. Get help right away if:  You feel confused.  Your vision is blurry.  You feel faint or pass out.  You have a severe headache.  You have severe abdominal, pelvic, or back pain.  You have chest pain, shortness of breath, or an irregular or fast heartbeat.  You are unable to urinate or you urinate less than normal.  You develop abnormal bleeding, such as bleeding from the rectum, vagina, nose, lungs, or nipples.  You vomit blood.  You have thoughts about harming yourself or committing suicide.  You are worried that you might harm someone else. This information   is not intended to replace advice given to you by your health care provider. Make sure you discuss any questions you have with your health care provider. Document Released: 10/31/2006 Document Revised: 06/11/2015 Document Reviewed: 05/07/2013 Elsevier Interactive Patient Education  2018 Elsevier Inc.  

## 2017-09-15 NOTE — Progress Notes (Signed)
Assessment & Plan:  Bethany Young was seen today for new patient (initial visit).  Diagnoses and all orders for this visit:  Insomnia, unspecified type -     traZODone (DESYREL) 50 MG tablet; Take 1 tablet (50 mg total) by mouth at bedtime.  Obesity (BMI 30-39.9) -     Amb ref to Medical Nutrition Therapy-MNT Discussed diet and exercise for person with BMI >25. Instructed: You must burn more calories than you eat. Losing 5 percent of your body weight should be considered a success. In the longer term, losing more than 15 percent of your body weight and staying at this weight is an extremely good result. However, keep in mind that even losing 5 percent of your body weight leads to important health benefits, so try not to get discouraged if you're not able to lose more than this. Will recheck weight in 3-6 months.  Encounter for allergy testing -     Ambulatory referral to Allergy -     Lipid panel  Encounter for annual physical exam -     CBC -     CMP14+EGFR  Current mild episode of major depressive disorder, unspecified whether recurrent (HCC) -     traZODone (DESYREL) 50 MG tablet; Take 1 tablet (50 mg total) by mouth at bedtime.    Patient has been counseled on age-appropriate routine health concerns for screening and prevention. These are reviewed and up-to-date. Referrals have been placed accordingly. Immunizations are up-to-date or declined.    Subjective:   Chief Complaint  Patient presents with  . New Patient (Initial Visit)    Pt. stated she is having trouble to go to sleep. Patient stated she would like to have labs done    HPI Bethany Young 22 y.o. female presents to office today to establish care.    Insomnia She has complaints of insomnia. This has been ongoing since "as long as I can remember". She works second shift and gets off around 10pm.  Sometimes takes 1-3 hours to fall asleep at night. She does not drink caffeine at night but she does endorse being on her  phone (social media) for at least an hour prior to bedtime.  When she is finally able to fall asleep she does not wake up. She does not nap during the day. She has tried melatonin in the past but states it did not help to relieve her symptoms. She does not snore at night and she denies excessive daytime sleepiness. Although she is obese I do not think she needs a sleep study.  She also receiving B12 injections once a month. As her depression screening is also positive I will start her on trazodone to hopefully help with both.   Weight Gain She has complaints of difficulty losing weight. TSH a few months ago was normal. She reports eating fairly healthy: smoothies, salads, fruits for snacks. She did stop exercising as she felt this was contributing to her insomnia at night. She reports walking during her breaks at work and doing squats. I would recommend that she see a nutritionist/dietician. Based on subjective details today I am unable to identify the cause of her difficulty gaining weight. I am not sure how forthcoming she is being regarding her dietary intake. She may need to begin logging her dietary intake daily.   Depression She is currently looking for a psychotherapist but states due to her job she has not found much time to actually reach out to anyone. States her mother has  chronic health issues and was diagnosed with OCD, fibromyalgia and chronic back pain. She can not recall if her mother was diagnosed with any bipolar disorder, depression or anxiety.  Depression screen PHQ 2/9 09/15/2017  Decreased Interest 1  Down, Depressed, Hopeless 1  PHQ - 2 Score 2  Altered sleeping 3  Tired, decreased energy 3  Change in appetite 2  Feeling bad or failure about yourself  0  Trouble concentrating 1  Moving slowly or fidgety/restless 1  Suicidal thoughts 1  PHQ-9 Score 13    Allergies She is requesting to be evaluated by an allergist. Currently taking OTC antihistamine with little relief of  symptoms. Also has used steroid nasal spray in the past with no relief.   Patient's symptoms include itchy eyes, itchy nose, nasal congestion, postnasal drip, sneezing and watery eyes. These symptoms are perennial with seasonal exacerbation. Current triggers include exposure to no known precipitant. Problems are chronic.  Immunotherapy has never been tried. The patient has never had nasal polyps. The patient has no history of asthma. The patient does not suffer from frequent sinopulmonary infections. The patient has not had sinus surgery in the past. The patient has no history of eczema. She would also like to be tested for alcohol allergies. States whenever she drinks certain wines or liquors her face swells. I have instructed her that this may not be the type of allergy testing that is performed and the best solution to this would be to stop drinking alcohol all together.      Review of Systems  Constitutional: Negative for fever, malaise/fatigue and weight loss.       See HPI  HENT: Negative.  Negative for nosebleeds.   Eyes: Negative.  Negative for blurred vision, double vision and photophobia.  Respiratory: Negative.  Negative for cough and shortness of breath.   Cardiovascular: Negative.  Negative for chest pain, palpitations and leg swelling.  Gastrointestinal: Negative.  Negative for heartburn, nausea and vomiting.  Musculoskeletal: Negative.  Negative for myalgias.  Neurological: Negative.  Negative for dizziness, focal weakness, seizures and headaches.  Psychiatric/Behavioral: Positive for depression and suicidal ideas (denies currently). The patient has insomnia.     History reviewed. No pertinent past medical history.  Past Surgical History:  Procedure Laterality Date  . EYELID REPAIR W/ SKIN GRAFT      Family History  Problem Relation Age of Onset  . Cancer Mother   . Hypertension Father     Social History Reviewed with no changes to be made today.   Outpatient  Medications Prior to Visit  Medication Sig Dispense Refill  . nitrofurantoin, macrocrystal-monohydrate, (MACROBID) 100 MG capsule Take 1 capsule (100 mg total) by mouth 2 (two) times daily. (Patient not taking: Reported on 09/15/2017) 14 capsule 1   No facility-administered medications prior to visit.     No Known Allergies     Objective:    BP 107/74 (BP Location: Right Arm, Patient Position: Sitting, Cuff Size: Large)   Pulse 78   Temp 98.8 F (37.1 C) (Oral)   Ht '5\' 8"'  (1.727 m)   Wt 207 lb 9.6 oz (94.2 kg)   LMP 08/31/2017   SpO2 100%   BMI 31.57 kg/m  Wt Readings from Last 3 Encounters:  09/15/17 207 lb 9.6 oz (94.2 kg)  05/08/17 215 lb (97.5 kg)  04/24/17 215 lb 1.6 oz (97.6 kg)    Physical Exam  Constitutional: She is oriented to person, place, and time. She appears well-developed and well-nourished.  She is cooperative.  HENT:  Head: Normocephalic and atraumatic.  Eyes: EOM are normal.  Neck: Normal range of motion.  Cardiovascular: Normal rate, regular rhythm and normal heart sounds. Exam reveals no gallop and no friction rub.  No murmur heard. Pulmonary/Chest: Effort normal and breath sounds normal. No tachypnea. No respiratory distress. She has no decreased breath sounds. She has no wheezes. She has no rhonchi. She has no rales. She exhibits no tenderness.  Abdominal: Bowel sounds are normal.  Musculoskeletal: Normal range of motion. She exhibits no edema.  Neurological: She is alert and oriented to person, place, and time. Coordination normal.  Skin: Skin is warm and dry.  Psychiatric: She has a normal mood and affect. Her behavior is normal. Judgment and thought content normal.  Nursing note and vitals reviewed.        Patient has been counseled extensively about nutrition and exercise as well as the importance of adherence with medications and regular follow-up. The patient was given clear instructions to go to ER or return to medical center if symptoms  don't improve, worsen or new problems develop. The patient verbalized understanding.   Follow-up: Return for Schedule for physical.   Gildardo Pounds, FNP-BC [facility] [place], [place]   09/15/2017, 2:48 PM

## 2017-09-16 LAB — CBC
HEMOGLOBIN: 12.5 g/dL (ref 11.1–15.9)
Hematocrit: 39.3 % (ref 34.0–46.6)
MCH: 27.8 pg (ref 26.6–33.0)
MCHC: 31.8 g/dL (ref 31.5–35.7)
MCV: 87 fL (ref 79–97)
Platelets: 339 10*3/uL (ref 150–450)
RBC: 4.5 x10E6/uL (ref 3.77–5.28)
RDW: 13.1 % (ref 12.3–15.4)
WBC: 9.3 10*3/uL (ref 3.4–10.8)

## 2017-09-16 LAB — CMP14+EGFR
A/G RATIO: 1.7 (ref 1.2–2.2)
ALBUMIN: 4.5 g/dL (ref 3.5–5.5)
ALT: 8 IU/L (ref 0–32)
AST: 10 IU/L (ref 0–40)
Alkaline Phosphatase: 84 IU/L (ref 39–117)
BUN/Creatinine Ratio: 13 (ref 9–23)
BUN: 10 mg/dL (ref 6–20)
Bilirubin Total: 0.3 mg/dL (ref 0.0–1.2)
CALCIUM: 9.6 mg/dL (ref 8.7–10.2)
CO2: 21 mmol/L (ref 20–29)
Chloride: 103 mmol/L (ref 96–106)
Creatinine, Ser: 0.77 mg/dL (ref 0.57–1.00)
GFR, EST AFRICAN AMERICAN: 125 mL/min/{1.73_m2} (ref >59)
GFR, EST NON AFRICAN AMERICAN: 108 mL/min/{1.73_m2} (ref >59)
GLUCOSE: 80 mg/dL (ref 65–99)
Globulin, Total: 2.6 g/dL (ref 1.5–4.5)
Potassium: 4.2 mmol/L (ref 3.5–5.2)
Sodium: 141 mmol/L (ref 134–144)
Total Protein: 7.1 g/dL (ref 6.0–8.5)

## 2017-09-16 LAB — LIPID PANEL
CHOL/HDL RATIO: 2.4 ratio (ref 0.0–4.4)
Cholesterol, Total: 134 mg/dL (ref 100–199)
HDL: 55 mg/dL (ref >39)
LDL CALC: 70 mg/dL (ref 0–99)
Triglycerides: 46 mg/dL (ref 0–149)
VLDL CHOLESTEROL CAL: 9 mg/dL (ref 5–40)

## 2017-10-18 ENCOUNTER — Encounter: Payer: Self-pay | Admitting: Nurse Practitioner

## 2017-10-18 ENCOUNTER — Ambulatory Visit: Payer: 59 | Attending: Nurse Practitioner | Admitting: Nurse Practitioner

## 2017-10-18 VITALS — BP 108/74 | HR 87 | Temp 98.8°F | Ht 68.0 in | Wt 206.8 lb

## 2017-10-18 DIAGNOSIS — Z0000 Encounter for general adult medical examination without abnormal findings: Secondary | ICD-10-CM | POA: Diagnosis not present

## 2017-10-18 DIAGNOSIS — Z8249 Family history of ischemic heart disease and other diseases of the circulatory system: Secondary | ICD-10-CM | POA: Insufficient documentation

## 2017-10-18 DIAGNOSIS — R221 Localized swelling, mass and lump, neck: Secondary | ICD-10-CM | POA: Diagnosis not present

## 2017-10-18 DIAGNOSIS — Z79899 Other long term (current) drug therapy: Secondary | ICD-10-CM | POA: Insufficient documentation

## 2017-10-18 DIAGNOSIS — G4700 Insomnia, unspecified: Secondary | ICD-10-CM | POA: Insufficient documentation

## 2017-10-18 NOTE — Progress Notes (Signed)
Assessment & Plan:  Bethany Young was seen today for annual exam.  Diagnoses and all orders for this visit:  Encounter for annual physical examination excluding gynecological examination in a patient older than 17 years Schedule with nurse for bilateral ear cerumen removal  Fullness of neck -     US THYROID; Future    Patient has been counseled on age-appropriate routine health concerns for screening and prevention. These are reviewed and up-to-date. Referrals have been placed accordingly. Immunizations are up-to-date or declined.    Subjective:   Chief Complaint  Patient presents with  . Annual Exam    Pt. is here for a physical.   HPI Bethany Young 22 y.o. female presents to office today   Review of Systems  Constitutional: Negative.  Negative for chills, fever, malaise/fatigue and weight loss.  HENT: Negative.  Negative for congestion, hearing loss, sinus pain and sore throat.   Eyes: Negative.  Negative for blurred vision, double vision, photophobia and pain.  Respiratory: Negative.  Negative for cough, sputum production, shortness of breath and wheezing.   Cardiovascular: Negative.  Negative for chest pain and leg swelling.  Gastrointestinal: Negative.  Negative for abdominal pain, constipation, diarrhea, heartburn, nausea and vomiting.  Genitourinary: Negative.   Musculoskeletal: Negative.  Negative for joint pain and myalgias.  Skin: Negative.  Negative for rash.  Neurological: Negative.  Negative for dizziness, tremors, speech change, focal weakness, seizures and headaches.  Endo/Heme/Allergies: Negative.  Negative for environmental allergies.  Psychiatric/Behavioral: Negative for depression and suicidal ideas. The patient has insomnia. The patient is not nervous/anxious.     History reviewed. No pertinent past medical history.  Past Surgical History:  Procedure Laterality Date  . EYELID REPAIR W/ SKIN GRAFT      Family History  Problem Relation Age of Onset  .  Cancer Mother   . Hypertension Father     Social History Reviewed with no changes to be made today.   Outpatient Medications Prior to Visit  Medication Sig Dispense Refill  . traZODone (DESYREL) 50 MG tablet Take 1 tablet (50 mg total) by mouth at bedtime. (Patient not taking: Reported on 10/18/2017) 30 tablet 2   No facility-administered medications prior to visit.     No Known Allergies     Objective:    BP 108/74 (BP Location: Left Arm, Patient Position: Sitting, Cuff Size: Large)   Pulse 87   Temp 98.8 F (37.1 C) (Oral)   Ht 5\' 8"  (1.727 m)   Wt 206 lb 12.8 oz (93.8 kg)   LMP 10/02/2017   SpO2 100%   BMI 31.44 kg/m  Wt Readings from Last 3 Encounters:  10/18/17 206 lb 12.8 oz (93.8 kg)  09/15/17 207 lb 9.6 oz (94.2 kg)  05/08/17 215 lb (97.5 kg)    Physical Exam  Constitutional: She is oriented to person, place, and time. She appears well-developed and well-nourished.  HENT:  Head: Normocephalic and atraumatic.    Right Ear: Hearing, external ear and ear canal normal.  Left Ear: Hearing, external ear and ear canal normal.  Nose: Mucosal edema present. Right sinus exhibits no maxillary sinus tenderness and no frontal sinus tenderness. Left sinus exhibits no maxillary sinus tenderness and no frontal sinus tenderness.  Mouth/Throat: Uvula is midline and oropharynx is clear and moist. Normal dentition. No dental caries. No oropharyngeal exudate. Tonsils are 1+ on the right. Tonsils are 1+ on the left.  She wears braces on her teeth  Moderate bilateral ear cerumen production  Eyes: Pupils are equal, round, and reactive to light. Conjunctivae and EOM are normal. Right eye exhibits no discharge. No scleral icterus.    Neck: Trachea normal, normal range of motion and phonation normal. Neck supple. No tracheal deviation present. No thyroid mass and no thyromegaly present.  There is moderate palpable neck fullness throughout.  Thyroid feels slightly enlarged liver due to  body habitus it is difficult to ascertain the actual size of the thyroid  Cardiovascular: Normal rate, regular rhythm, normal heart sounds and intact distal pulses. Exam reveals no friction rub.  No murmur heard. Pulses:      Dorsalis pedis pulses are 2+ on the right side, and 2+ on the left side.       Posterior tibial pulses are 2+ on the right side, and 2+ on the left side.  Pulmonary/Chest: Effort normal and breath sounds normal. No accessory muscle usage. No respiratory distress. She has no decreased breath sounds. She has no wheezes. She has no rhonchi. She has no rales. She exhibits no tenderness.  Declined breast exam  Abdominal: Soft. Bowel sounds are normal. She exhibits no distension and no mass. There is no tenderness. There is no rebound and no guarding.  Genitourinary:  Genitourinary Comments:   Deferred.  Pap due in 2022  Musculoskeletal: Normal range of motion. She exhibits no edema, tenderness or deformity.       Right foot: There is normal range of motion.       Left foot: There is normal range of motion.  Feet:  Right Foot:  Skin Integrity: Negative for skin breakdown.  Left Foot:  Skin Integrity: Negative for skin breakdown.  Lymphadenopathy:    She has no cervical adenopathy.  Neurological: She is alert and oriented to person, place, and time. She has normal strength. No cranial nerve deficit or sensory deficit. She displays a negative Romberg sign. Coordination and gait normal.  Reflex Scores:      Patellar reflexes are 1+ on the right side and 1+ on the left side. Skin: Skin is warm and dry. No erythema.  Psychiatric: She has a normal mood and affect. Her speech is normal and behavior is normal. Judgment and thought content normal.       Patient has been counseled extensively about nutrition and exercise as well as the importance of adherence with medications and regular follow-up. The patient was given clear instructions to go to ER or return to medical center if  symptoms don't improve, worsen or new problems develop. The patient verbalized understanding.   Follow-up: Return if symptoms worsen or fail to improve, for make nurse appt for ear wax removal.   Bethany Rigg, FNP-BC [facility] and [facility] [place], [place] [phone]   10/18/2017, 1:14 PM

## 2017-10-26 ENCOUNTER — Telehealth: Payer: Self-pay

## 2017-10-26 NOTE — Telephone Encounter (Signed)
CMA attempt to reach patient regarding scheduling an appt. For her Ultrasound Thyroid.  No answer and left a VM for patient.  If patient call back, please ask what day and time would she like to schedule.

## 2017-11-02 NOTE — Telephone Encounter (Signed)
Pt called back to request an appointment for her ultrasound, she requested her appointment be scheduled for a morning any day except(21,23,30,29 of October). Please follow up

## 2017-11-02 NOTE — Telephone Encounter (Signed)
CMA spoke to patient and inform she have an ultrasound schedule on 11/07/2017 at 7am.  Pt. Understood.

## 2017-11-07 ENCOUNTER — Ambulatory Visit (HOSPITAL_COMMUNITY)
Admission: RE | Admit: 2017-11-07 | Discharge: 2017-11-07 | Disposition: A | Discharge status: Home / Self Care | Payer: 59 | Source: Ambulatory Visit | Attending: Nurse Practitioner | Admitting: Nurse Practitioner

## 2017-11-07 DIAGNOSIS — R221 Localized swelling, mass and lump, neck: Secondary | ICD-10-CM | POA: Diagnosis present

## 2017-11-22 ENCOUNTER — Ambulatory Visit: Payer: 59 | Admitting: Nurse Practitioner

## 2017-11-29 ENCOUNTER — Ambulatory Visit: Payer: 59 | Admitting: Advanced Practice Midwife

## 2017-12-08 ENCOUNTER — Encounter: Payer: Self-pay | Admitting: Nurse Practitioner

## 2017-12-08 ENCOUNTER — Ambulatory Visit: Payer: 59 | Attending: Nurse Practitioner | Admitting: Nurse Practitioner

## 2017-12-08 VITALS — BP 119/81 | HR 82 | Temp 98.8°F | Ht 68.0 in | Wt 210.0 lb

## 2017-12-08 DIAGNOSIS — Z8249 Family history of ischemic heart disease and other diseases of the circulatory system: Secondary | ICD-10-CM | POA: Insufficient documentation

## 2017-12-08 DIAGNOSIS — Z6831 Body mass index (BMI) 31.0-31.9, adult: Secondary | ICD-10-CM | POA: Insufficient documentation

## 2017-12-08 DIAGNOSIS — E669 Obesity, unspecified: Secondary | ICD-10-CM | POA: Diagnosis not present

## 2017-12-08 DIAGNOSIS — Z79899 Other long term (current) drug therapy: Secondary | ICD-10-CM | POA: Insufficient documentation

## 2017-12-08 NOTE — Patient Instructions (Signed)
Preventing Unhealthy Weight Gain, Adult Staying at a healthy weight is important. When fat builds up in your body, you may become overweight or obese. These conditions put you at greater risk for developing certain health problems, such as heart disease, diabetes, sleeping problems, joint problems, and some cancers. Unhealthy weight gain is often the result of making unhealthy choices in what you eat. It is also a result of not getting enough exercise. You can make changes to your lifestyle to prevent obesity and stay as healthy as possible. What nutrition changes can be made? To maintain a healthy weight and prevent obesity:  Eat only as much as your body needs. To do this: ? Pay attention to signs that you are hungry or full. Stop eating as soon as you feel full. ? If you feel hungry, try drinking water first. Drink enough water so your urine is clear or pale yellow. ? Eat smaller portions. ? Look at serving sizes on food labels. Most foods contain more than one serving per container. ? Eat the recommended amount of calories for your gender and activity level. While most active people should eat around 2,000 calories per day, if you are trying to lose weight or are not very active, you main need to eat less calories. Talk to your health care provider or dietitian about how many calories you should eat each day.  Choose healthy foods, such as: ? Fruits and vegetables. Try to fill at least half of your plate at each meal with fruits and vegetables. ? Whole grains, such as whole wheat bread, brown rice, and quinoa. ? Lean meats, such as chicken or fish. ? Other healthy proteins, such as beans, eggs, or tofu. ? Healthy fats, such as nuts, seeds, fatty fish, and olive oil. ? Low-fat or fat-free dairy.  Check food labels and avoid food and drinks that: ? Are high in calories. ? Have added sugar. ? Are high in sodium. ? Have saturated fats or trans fats.  Limit how much you eat of the following  foods: ? Prepackaged meals. ? Fast food. ? Fried foods. ? Processed meat, such as bacon, sausage, and deli meats. ? Fatty cuts of red meat and poultry with skin.  Cook foods in healthier ways, such as by baking, broiling, or grilling.  When grocery shopping, try to shop around the outside of the store. This helps you buy mostly fresh foods and avoid canned and prepackaged foods.  What lifestyle changes can be made?  Exercise at least 30 minutes 5 or more days each week. Exercising includes brisk walking, yard work, biking, running, swimming, and team sports like basketball and soccer. Ask your health care provider which exercises are safe for you.  Do not use any products that contain nicotine or tobacco, such as cigarettes and e-cigarettes. If you need help quitting, ask your health care provider.  Limit alcohol intake to no more than 1 drink a day for nonpregnant women and 2 drinks a day for men. One drink equals 12 oz of beer, 5 oz of wine, or 1 oz of hard liquor.  Try to get 7-9 hours of sleep each night. What other changes can be made?  Keep a food and activity journal to keep track of: ? What you ate and how many calories you had. Remember to count sauces, dressings, and side dishes. ? Whether you were active, and what exercises you did. ? Your calorie, weight, and activity goals.  Check your weight regularly. Track any changes.   If you notice you have gained weight, make changes to your diet or activity routine.  Avoid taking weight-loss medicines or supplements. Talk to your health care provider before starting any new medicine or supplement.  Talk to your health care provider before trying any new diet or exercise plan. Why are these changes important? Eating healthy, staying active, and having healthy habits not only help prevent obesity, they also:  Help you to manage stress and emotions.  Help you to connect with friends and family.  Improve your  self-esteem.  Improve your sleep.  Prevent long-term health problems.  What can happen if changes are not made? Being obese or overweight can cause you to develop joint or bone problems, which can make it hard for you to stay active or do activities you enjoy. Being obese or overweight also puts stress on your heart and lungs and can lead to health problems like diabetes, heart disease, and some cancers. Where to find more information: Talk with your health care provider or a dietitian about healthy eating and healthy lifestyle choices. You may also find other information through these resources:  U.[place]. Department of Agriculture MyPlate: FormerBoss.no  American Heart Association: www.heart.org  Centers for Disease Control and Prevention: http://www.wolf.info/  Summary  Staying at a healthy weight is important. It helps prevent certain diseases and health problems, such as heart disease, diabetes, joint problems, sleep disorders, and some cancers.  Being obese or overweight can cause you to develop joint or bone problems, which can make it hard for you to stay active or do activities you enjoy.  You can prevent unhealthy weight gain by eating a healthy diet, exercising regularly, not smoking, limiting alcohol, and getting enough sleep.  Talk with your health care provider or a dietitian for guidance about healthy eating and healthy lifestyle choices. This information is not intended to replace advice given to you by your health care provider. Make sure you discuss any questions you have with your health care provider. Document Released: 01/05/2016 Document Revised: 02/10/2016 Document Reviewed: 02/10/2016 Elsevier Interactive Patient Education  2018 [employer] Calories are units of energy. Your body needs a certain amount of calories from food to keep you going throughout the day. When you eat more calories than your body needs, your body stores the  extra calories as fat. When you eat fewer calories than your body needs, your body burns fat to get the energy it needs. Calorie counting means keeping track of how many calories you eat and drink each day. Calorie counting can be helpful if you need to lose weight. If you make sure to eat fewer calories than your body needs, you should lose weight. Ask your health care provider what a healthy weight is for you. For calorie counting to work, you will need to eat the right number of calories in a day in order to lose a healthy amount of weight per week. A dietitian can help you determine how many calories you need in a day and will give you suggestions on how to reach your calorie goal.  A healthy amount of weight to lose per week is usually 1-2 lb (0.5-0.9 kg). This usually means that your daily calorie intake should be reduced by 500-750 calories.  Eating 1,200 - 1,500 calories per day can help most women lose weight.  Eating 1,500 - 1,800 calories per day can help most men lose weight.  What is my plan? My goal is to  have __________ calories per day. If I have this many calories per day, I should lose around __________ pounds per week. What do I need to know about calorie counting? In order to meet your daily calorie goal, you will need to:  Find out how many calories are in each food you would like to eat. Try to do this before you eat.  Decide how much of the food you plan to eat.  Write down what you ate and how many calories it had. Doing this is called keeping a food log.  To successfully lose weight, it is important to balance calorie counting with a healthy lifestyle that includes regular activity. Aim for 150 minutes of moderate exercise (such as walking) or 75 minutes of vigorous exercise (such as running) each week. Where do I find calorie information?  The number of calories in a food can be found on a Nutrition Facts label. If a food does not have a Nutrition Facts label, try  to look up the calories online or ask your dietitian for help. Remember that calories are listed per serving. If you choose to have more than one serving of a food, you will have to multiply the calories per serving by the amount of servings you plan to eat. For example, the label on a package of bread might say that a serving size is 1 slice and that there are 90 calories in a serving. If you eat 1 slice, you will have eaten 90 calories. If you eat 2 slices, you will have eaten 180 calories. How do I keep a food log? Immediately after each meal, record the following information in your food log:  What you ate. Don't forget to include toppings, sauces, and other extras on the food.  How much you ate. This can be measured in cups, ounces, or number of items.  How many calories each food and drink had.  The total number of calories in the meal.  Keep your food log near you, such as in a small notebook in your pocket, or use a mobile app or website. Some programs will calculate calories for you and show you how many calories you have left for the day to meet your goal. What are some calorie counting tips?  Use your calories on foods and drinks that will fill you up and not leave you hungry: ? Some examples of foods that fill you up are nuts and nut butters, vegetables, lean proteins, and high-fiber foods like whole grains. High-fiber foods are foods with more than 5 g fiber per serving. ? Drinks such as sodas, specialty coffee drinks, alcohol, and juices have a lot of calories, yet do not fill you up.  Eat nutritious foods and avoid empty calories. Empty calories are calories you get from foods or beverages that do not have many vitamins or protein, such as candy, sweets, and soda. It is better to have a nutritious high-calorie food (such as an avocado) than a food with few nutrients (such as a bag of chips).  Know how many calories are in the foods you eat most often. This will help you calculate  calorie counts faster.  Pay attention to calories in drinks. Low-calorie drinks include water and unsweetened drinks.  Pay attention to nutrition labels for "low fat" or "fat free" foods. These foods sometimes have the same amount of calories or more calories than the full fat versions. They also often have added sugar, starch, or salt, to make up for  flavor that was removed with the fat.  Find a way of tracking calories that works for you. Get creative. Try different apps or programs if writing down calories does not work for you. What are some portion control tips?  Know how many calories are in a serving. This will help you know how many servings of a certain food you can have.  Use a measuring cup to measure serving sizes. You could also try weighing out portions on a kitchen scale. With time, you will be able to estimate serving sizes for some foods.  Take some time to put servings of different foods on your favorite plates, bowls, and cups so you know what a serving looks like.  Try not to eat straight from a bag or box. Doing this can lead to overeating. Put the amount you would like to eat in a cup or on a plate to make sure you are eating the right portion.  Use smaller plates, glasses, and bowls to prevent overeating.  Try not to multitask (for example, watch TV or use your computer) while eating. If it is time to eat, sit down at a table and enjoy your food. This will help you to know when you are full. It will also help you to be aware of what you are eating and how much you are eating. What are tips for following this plan? Reading food labels  Check the calorie count compared to the serving size. The serving size may be smaller than what you are used to eating.  Check the source of the calories. Make sure the food you are eating is high in vitamins and protein and low in saturated and trans fats. Shopping  Read nutrition labels while you shop. This will help you make healthy  decisions before you decide to purchase your food.  Make a grocery list and stick to it. Cooking  Try to cook your favorite foods in a healthier way. For example, try baking instead of frying.  Use low-fat dairy products. Meal planning  Use more fruits and vegetables. Half of your plate should be fruits and vegetables.  Include lean proteins like poultry and fish. How do I count calories when eating out?  Ask for smaller portion sizes.  Consider sharing an entree and sides instead of getting your own entree.  If you get your own entree, eat only half. Ask for a box at the beginning of your meal and put the rest of your entree in it so you are not tempted to eat it.  If calories are listed on the menu, choose the lower calorie options.  Choose dishes that include vegetables, fruits, whole grains, low-fat dairy products, and lean protein.  Choose items that are boiled, broiled, grilled, or steamed. Stay away from items that are buttered, battered, fried, or served with cream sauce. Items labeled "crispy" are usually fried, unless stated otherwise.  Choose water, low-fat milk, unsweetened iced tea, or other drinks without added sugar. If you want an alcoholic beverage, choose a lower calorie option such as a glass of wine or light beer.  Ask for dressings, sauces, and syrups on the side. These are usually high in calories, so you should limit the amount you eat.  If you want a salad, choose a garden salad and ask for grilled meats. Avoid extra toppings like bacon, cheese, or fried items. Ask for the dressing on the side, or ask for olive oil and vinegar or lemon to use as dressing.  Estimate how many servings of a food you are given. For example, a serving of cooked rice is  cup or about the size of half a baseball. Knowing serving sizes will help you be aware of how much food you are eating at restaurants. The list below tells you how big or small some common portion sizes are based  on everyday objects: ? 1 oz-4 stacked dice. ? 3 oz-1 deck of cards. ? 1 tsp-1 die. ? 1 Tbsp- a ping-pong ball. ? 2 Tbsp-1 ping-pong ball. ?  cup- baseball. ? 1 cup-1 baseball. Summary  Calorie counting means keeping track of how many calories you eat and drink each day. If you eat fewer calories than your body needs, you should lose weight.  A healthy amount of weight to lose per week is usually 1-2 lb (0.5-0.9 kg). This usually means reducing your daily calorie intake by 500-750 calories.  The number of calories in a food can be found on a Nutrition Facts label. If a food does not have a Nutrition Facts label, try to look up the calories online or ask your dietitian for help.  Use your calories on foods and drinks that will fill you up, and not on foods and drinks that will leave you hungry.  Use smaller plates, glasses, and bowls to prevent overeating. This information is not intended to replace advice given to you by your health care provider. Make sure you discuss any questions you have with your health care provider. Document Released: 01/03/2005 Document Revised: 12/04/2015 Document Reviewed: 12/04/2015 Elsevier Interactive Patient Education  2018 [employer] Serving Sizes A serving size is a measured amount of food or drink, such as one slice of bread, that has an associated nutrient content. Knowing the serving size of a food or drink can help you determine how much of that food you should consume. What is the size of one serving? The size of one healthy serving depends on the food or drink. To determine a serving size, read the food label. If the food or drink does not have a food label, try to find serving size information online. Or, use the following to estimate the size of one adult serving: Grain 1 slice bread.  bagel.  cup pasta. Vegetable  cup cooked or canned vegetables. 1 cup raw, leafy greens. Fruit  cup canned fruit. 1 medium fruit.  cup dried  fruit. Meat and Other Protein Sources 1 oz meat, poultry, or fish.  cup cooked beans. 1 egg.  cup nuts or seeds. 1 Tbsp nut butter.  cup tofu or tempeh. 2 Tbsp hummus. Dairy An individual container of yogurt (6-8 oz). 1 piece of cheese the size of your thumb (1 oz). 1 cup (8 oz) milk or milk alternative. Fat A piece the size of one dice. 1 tsp soft margarine. 1 Tbsp mayonnaise. 1 tsp vegetable oil. 1 Tbsp regular salad dressing. 2 Tbsp low-fat salad dressing. How many servings should I eat from each food group each day? The following are the suggested number of servings to try and have every day from each food group. You can also look at your eating throughout the week and aim for meeting these requirements on most days for overall healthy eating. Grain 6-8 servings. Try to have half of your grains from whole grains, such as whole wheat bread, corn tortillas, oatmeal, brown rice, whole wheat pasta, and bulgur. Vegetable At least 2-3 servings. Fruit 2 servings. Meat and Other Protein Foods 5-6 servings. Aim to  have lean proteins, such as chicken, [place], fish, beans, or tofu. Dairy 3 servings. Choose low-fat or nonfat if you are trying to control your weight. Fat 2-3 servings. Is a serving the same thing as a portion? No. A portion is the actual amount you eat, which may be more than one serving. Knowing the specific serving size of a food and the nutritional information that goes with it can help you make a healthy decision on what size portion to eat. What are some tips to help me learn healthy serving sizes?  Check food labels for serving sizes. Many foods that come as a single portion actually contain multiple servings.  Determine the serving size of foods you commonly eat and figure out how large a portion you usually eat.  Measure the number of servings that can be held by the bowls, glasses, cups, and plates you typically use. For example, pour your breakfast cereal into your  regular bowl and then pour it into a measuring cup.  For 1-2 days, measure the serving sizes of all the foods you eat.  Practice estimating serving sizes and determining how big your portions should be. This information is not intended to replace advice given to you by your health care provider. Make sure you discuss any questions you have with your health care provider. Document Released: 10/02/2002 Document Revised: 08/29/2015 Document Reviewed: 04/02/2013 Elsevier Interactive Patient Education  [employer].

## 2017-12-08 NOTE — Progress Notes (Signed)
Assessment & Plan:  [name] was seen today for follow-up.  Diagnoses and all orders for this visit:  Obesity (BMI 30-39.9) -     Amb Ref to Medical Weight Management Discussed diet and exercise for person with BMI >30. Instructed: You must burn more calories than you eat. Losing 5 percent of your body weight should be considered a success. In the longer term, losing more than 15 percent of your body weight and staying at this weight is an extremely good result. However, keep in mind that even losing 5 percent of your body weight leads to important health benefits, so try not to get discouraged if you're not able to lose more than this. Will recheck weight in 3-6 months.    Patient has been counseled on age-appropriate routine health concerns for screening and prevention. These are reviewed and up-to-date. Referrals have been placed accordingly. Immunizations are up-to-date or declined.    Subjective:   Chief Complaint  Patient presents with  . Follow-up    Pt. is stated she is here for a follow-up.    HPI Bethany Young 22 y.o. female presents to office today with concerns of obesity. She states she is working out at least 3 times per week but states she is always hungry so she does overeat. Her BMI is 31.93. She is requesting phentermine today. I would like for her to speak with a nutritionist. I do not want to prescribe her an appetite suppressant at this time. I feel she may benefit from behavioral modifications as well as education regarding proper nutrition.   Review of Systems  Constitutional: Negative for fever, malaise/fatigue and weight loss.  HENT: Negative.  Negative for nosebleeds.   Eyes: Negative.  Negative for blurred vision, double vision and photophobia.  Respiratory: Negative.  Negative for cough and shortness of breath.   Cardiovascular: Negative.  Negative for chest pain, palpitations and leg swelling.  Gastrointestinal: Negative.  Negative for heartburn, nausea  and vomiting.  Musculoskeletal: Negative.  Negative for myalgias.  Neurological: Negative.  Negative for dizziness, focal weakness, seizures and headaches.  Psychiatric/Behavioral: Negative.  Negative for suicidal ideas.    History reviewed. No pertinent past medical history.  Past Surgical History:  Procedure Laterality Date  . EYELID REPAIR W/ SKIN GRAFT      Family History  Problem Relation Age of Onset  . Cancer Mother   . Hypertension Father     Social History Reviewed with no changes to be made today.   Outpatient Medications Prior to Visit  Medication Sig Dispense Refill  . traZODone (DESYREL) 50 MG tablet Take 1 tablet (50 mg total) by mouth at bedtime. (Patient not taking: Reported on 10/18/2017) 30 tablet 2   No facility-administered medications prior to visit.     No Known Allergies     Objective:    BP 119/81 (BP Location: Right Arm, Patient Position: Sitting, Cuff Size: Normal)   Pulse 82   Temp 98.8 F (37.1 C) (Oral)   Ht 5\' 8"  (1.727 m)   Wt 210 lb (95.3 kg)   LMP 11/13/2017   SpO2 96%   BMI 31.93 kg/m  Wt Readings from Last 3 Encounters:  12/08/17 210 lb (95.3 kg)  10/18/17 206 lb 12.8 oz (93.8 kg)  09/15/17 207 lb 9.6 oz (94.2 kg)    Physical Exam  Constitutional: She is oriented to person, place, and time. She appears well-developed and well-nourished. She is cooperative.  HENT:  Head: Normocephalic and atraumatic.  Eyes:  EOM are normal.  Neck: Normal range of motion.  Cardiovascular: Normal rate, regular rhythm, normal heart sounds and intact distal pulses. Exam reveals no gallop and no friction rub.  No murmur heard. Pulmonary/Chest: Effort normal and breath sounds normal. No tachypnea. No respiratory distress. She has no decreased breath sounds. She has no wheezes. She has no rhonchi. She has no rales. She exhibits no tenderness.  Abdominal: Soft. Bowel sounds are normal.  Musculoskeletal: Normal range of motion. She exhibits no edema.    Neurological: She is alert and oriented to person, place, and time. Coordination normal.  Skin: Skin is warm and dry.  Psychiatric: She has a normal mood and affect. Her behavior is normal. Judgment and thought content normal.  Nursing note and vitals reviewed.      Patient has been counseled extensively about nutrition and exercise as well as the importance of adherence with medications and regular follow-up. The patient was given clear instructions to go to ER or return to medical center if symptoms don't improve, worsen or new problems develop. The patient verbalized understanding.   Follow-up: Return if symptoms worsen or fail to improve.   Claiborne RiggZelda W Zareena Willis, FNP-BC [facility] [place], [place] [phone]   12/08/2017, 1:58 PM

## 2018-01-08 ENCOUNTER — Ambulatory Visit (INDEPENDENT_AMBULATORY_CARE_PROVIDER_SITE_OTHER): Payer: 59 | Admitting: Family Medicine

## 2018-01-08 ENCOUNTER — Encounter: Payer: Self-pay | Admitting: Family Medicine

## 2018-01-08 VITALS — BP 112/83 | HR 77 | Wt 209.0 lb

## 2018-01-08 DIAGNOSIS — Z3202 Encounter for pregnancy test, result negative: Secondary | ICD-10-CM

## 2018-01-08 DIAGNOSIS — N939 Abnormal uterine and vaginal bleeding, unspecified: Secondary | ICD-10-CM

## 2018-01-08 LAB — POCT PREGNANCY, URINE: Preg Test, Ur: NEGATIVE

## 2018-01-08 MED ORDER — NORGESTIMATE-ETH ESTRADIOL 0.25-35 MG-MCG PO TABS: 1.0000 | ORAL_TABLET | Freq: Every day | ORAL | 3 refills | Status: DC

## 2018-01-08 NOTE — Progress Notes (Signed)
Had spotting for awhile and then for last 2 wks bleeding has been heavier. Used one pad today. This bleeding is not as much as a period. Occ cramping and occ pain LLQ. No pain now. Has never had abnormal vag bleeding. Has IUD since April 2019

## 2018-01-08 NOTE — Progress Notes (Signed)
   Subjective:    Bethany Young - 22 y.o. female MRN [id number]  Date of birth: 1995-08-03  HPI  Bethany ReachJessica Young is a 22 y.o.  female here for vaginal bleeding. Started recording periods starting on 12/12. Has been using about 1 overnight pad per day.  LMP 11/13/17. Had a paraguard IUD placed 04/24/17.  Reports normal periods since IUD placed. Never late.  Currently sexually active.  Used to have anemia, but has not had symptoms recently. However, a few days ago while running on the treadmill, did feel dizzy and had to get off to avoid passing out. Otherwise no symptoms of anemia now.   OB History   No obstetric history on file.       Health Maintenance:   Health Maintenance Due  Topic Date Due  . TETANUS/TDAP  02/04/2012    -  reports that she has been smoking e-cigarettes. She has never used smokeless tobacco. - Review of Systems: Per HPI. - Past Medical History: Patient Active Problem List   Diagnosis Date Noted  . Eczematous dermatitis of right upper eyelid 07/13/2017  . 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 Pain in right ankle and joints of right foot 05/08/2017  . Frequent headaches 04/24/2017  . Family history of ovarian cancer 04/24/2017   - Medications: reviewed and updated   Objective:   Physical Exam BP 112/83   Pulse 77   Wt 209 lb (94.8 kg)   LMP 11/13/2017   BMI 31.78 kg/m  Gen: NAD, alert, cooperative with exam, well-appearing HEENT: NCAT, PERRL, clear conjunctiva CV: RRR Resp: non-labored Skin: no rashes, normal turgor  Neuro: no gross deficits.  Psych: good insight, alert and oriented GU/GYN: deferred    Assessment & Plan:   1. Abnormal uterine bleeding - CBC - will prescribe 2257-month course of sprintec - encouraged to follow up if no improvement or worsening symptoms   Routine preventative health maintenance measures emphasized. Please refer to After Visit  Summary for other counseling recommendations.   Return if symptoms worsen or fail to improve.  Gwenevere AbbotNimeka Broderic Bara, MD  OB Fellow  01/08/2018, 7:58 PM

## 2018-01-09 LAB — CBC
HEMATOCRIT: 37.9 % (ref 34.0–46.6)
Hemoglobin: 12.2 g/dL (ref 11.1–15.9)
MCH: 27.8 pg (ref 26.6–33.0)
MCHC: 32.2 g/dL (ref 31.5–35.7)
MCV: 86 fL (ref 79–97)
Platelets: 345 10*3/uL (ref 150–450)
RBC: 4.39 x10E6/uL (ref 3.77–5.28)
RDW: 13.5 % (ref 12.3–15.4)
WBC: 11.5 10*3/uL — ABNORMAL HIGH (ref 3.4–10.8)

## 2018-02-13 ENCOUNTER — Encounter (HOSPITAL_COMMUNITY): Payer: Self-pay | Admitting: Emergency Medicine

## 2018-02-13 ENCOUNTER — Emergency Department (HOSPITAL_COMMUNITY)
Admission: EM | Admit: 2018-02-13 | Discharge: 2018-02-13 | Disposition: A | Discharge status: Home / Self Care | Payer: 59 | Attending: Emergency Medicine | Admitting: Emergency Medicine

## 2018-02-13 DIAGNOSIS — H18822 Corneal disorder due to contact lens, left eye: Secondary | ICD-10-CM | POA: Diagnosis not present

## 2018-02-13 DIAGNOSIS — Z79899 Other long term (current) drug therapy: Secondary | ICD-10-CM | POA: Insufficient documentation

## 2018-02-13 DIAGNOSIS — F17210 Nicotine dependence, cigarettes, uncomplicated: Secondary | ICD-10-CM | POA: Insufficient documentation

## 2018-02-13 DIAGNOSIS — H5712 Ocular pain, left eye: Secondary | ICD-10-CM | POA: Diagnosis present

## 2018-02-13 MED ORDER — FLUORESCEIN SODIUM 1 MG OP STRP
1.0000 | ORAL_STRIP | Freq: Once | OPHTHALMIC | Status: AC
  Administered 2018-02-13: 1 via OPHTHALMIC
  Filled 2018-02-13: qty 1

## 2018-02-13 MED ORDER — TOBRAMYCIN 0.3 % OP SOLN: 1.0000 [drp] | Freq: Four times a day (QID) | OPHTHALMIC | 0 refills | Status: DC

## 2018-02-13 MED ORDER — TETRACAINE HCL 0.5 % OP SOLN
1.0000 [drp] | Freq: Once | OPHTHALMIC | Status: AC
  Administered 2018-02-13: 1 [drp] via OPHTHALMIC
  Filled 2018-02-13: qty 4

## 2018-02-13 NOTE — ED Provider Notes (Signed)
[facility] EMERGENCY DEPARTMENT Provider Note   CSN: [id number] Arrival date & time: 02/13/18  0324     History   Chief Complaint Chief Complaint  Patient presents with  . Eye Pain    HPI Bethany Young is a 23 y.o. female who presents to ED for 7-hour history of left eye tearing and irritation.  States that an hour before symptoms began she removed her contact lenses.  She began experiencing excessive tearing and foreign body sensation since then.  She is used over-the-counter drops with no improvement.  No other trauma.  Reports blurry vision in the eye.  Denies any pain with EOMs, fever, purulent drainage.  HPI  History reviewed. No pertinent past medical history.  Patient Active Problem List   Diagnosis Date Noted  . Eczematous dermatitis of right upper eyelid 07/13/2017  . 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 Pain in right ankle and joints of right foot 05/08/2017  . Frequent headaches 04/24/2017  . Family history of ovarian cancer 04/24/2017    Past Surgical History:  Procedure Laterality Date  . EYELID REPAIR W/ SKIN GRAFT       OB History   No obstetric history on file.      Home Medications    Prior to Admission medications   Medication Sig Start Date End Date Taking? Authorizing Provider  ibuprofen (ADVIL,MOTRIN) 200 MG tablet Take 800 mg by mouth every 6 (six) hours as needed.    [provider]  norgestimate-ethinyl estradiol (ORTHO-CYCLEN,SPRINTEC,PREVIFEM) 0.25-35 MG-MCG tablet Take 1 tablet by mouth daily. 01/08/18   Gwenevere Abbot, MD  tobramycin (TOBREX) 0.3 % ophthalmic solution Place 1 drop into the left eye every 6 (six) hours. 02/13/18   Dietrich Pates, PA-C    Family History Family History  Problem Relation Age of Onset  . Cancer Mother   . Hypertension Father     Social History Social History   Tobacco Use  . Smoking  status: Current Every Day Smoker    Types: E-cigarettes  . Smokeless tobacco: Never Used  Substance Use Topics  . Alcohol use: Yes    Comment: weekend drinker  . Drug use: No    Types: Marijuana     Allergies   Ofloxacin   Review of Systems Review of Systems  Constitutional: Negative for chills and fever.  Eyes: Positive for discharge, redness and itching. Negative for photophobia, pain and visual disturbance.  Neurological: Negative for headaches.     Physical Exam Updated Vital Signs BP 113/72 (BP Location: Right Arm)   Pulse 77   Temp 98.2 F (36.8 C) (Oral)   Resp 18   SpO2 97%   Physical Exam Vitals signs and nursing note reviewed.  Constitutional:      General: She is not in acute distress.    Appearance: She is well-developed. She is not diaphoretic.  HENT:     Head: Normocephalic and atraumatic.  Eyes:     General: No scleral icterus.       Left eye: Discharge (clear) present.    Extraocular Movements:     Right eye: Normal extraocular motion.     Left eye: Normal extraocular motion.     Conjunctiva/sclera:     Left eye: Left conjunctiva is injected.     Pupils: Pupils are equal, round, and reactive to light.     Comments: Left eye with injected conjunctiva, no eyelid swelling or erythema  or tenderness to palpation.  Mild clear tearful drainage noted.  No foreign bodies noted.  No pain with EOMs.  No chemosis, proptosis, or consensual photophobia. Fluorescein stain with small corneal abrasion at the 4 o'clock position; foreign bodies, dendritic lesions, ulcerations, negative Sidel sign.  Neck:     Musculoskeletal: Normal range of motion.  Pulmonary:     Effort: Pulmonary effort is normal. No respiratory distress.  Skin:    Findings: No rash.  Neurological:     Mental Status: She is alert.      ED Treatments / Results  Labs (all labs ordered are listed, but only abnormal results are displayed) Labs Reviewed - No data to  display  EKG None  Radiology No results found.  Procedures Procedures (including critical care time)  Medications Ordered in ED Medications  tetracaine (PONTOCAINE) 0.5 % ophthalmic solution 1 drop (1 drop Left Eye Given by Other 02/13/18 0926)  fluorescein ophthalmic strip 1 strip (1 strip Left Eye Given 02/13/18 0927)     Initial Impression / Assessment and Plan / ED Course  I have reviewed the triage vital signs and the nursing notes.  Pertinent labs & imaging results that were available during my care of the patient were reviewed by me and considered in my medical decision making (see chart for details).     23 year old female presents to ED for left eye irritation, foreign body sensation and tearing.  Symptoms began several hours ago after she tried to remove her contact lenses.  She was able to remove the contact lenses.  Visual acuity decreased secondary to tearing.  Fluorescein exam revealed small corneal abrasion at the 4 o'clock position.  No foreign bodies noted.  EOMs are intact.  No signs of orbital cellulitis, iritis or keratitis.  Suspect that irritation is due to corneal abrasion.  Will give tobramycin eyedrops, advised patient to not wear contact lenses for appropriate amount of time.  Advised ophthalmology follow-up.  Return to ED for any severe worsening symptoms.  Patient is hemodynamically stable, in NAD, and able to ambulate in the ED. Evaluation does not show pathology that would require ongoing emergent intervention or inpatient treatment. I explained the diagnosis to the patient. Pain has been managed and has no complaints prior to discharge. Patient is comfortable with above plan and is stable for discharge at this time. All questions were answered prior to disposition. Strict return precautions for returning to the ED were discussed. Encouraged follow up with PCP.    Portions of this note were generated with Scientist, clinical (histocompatibility and immunogenetics)Dragon dictation software. Dictation errors may occur  despite best attempts at proofreading.   Final Clinical Impressions(s) / ED Diagnoses   Final diagnoses:  Corneal abrasion of left eye due to contact lens    ED Discharge Orders         Ordered    tobramycin (TOBREX) 0.3 % ophthalmic solution  Every 6 hours     02/13/18 0945           Dietrich PatesKhatri, Kimiyah Blick, PA-C 02/13/18 [id number]    Linwood DibblesKnapp, Jon, MD 02/13/18 1742

## 2018-02-13 NOTE — Discharge Instructions (Signed)
Use the eyedrops as directed. Do not wear your contact lenses until your symptoms have improved and into treatment is over. Follow-up with your eye doctor or the eye doctor listed below for further evaluation. Return to ED for worsening symptoms, increased swelling, pain with moving your eye, increased swelling.

## 2018-02-13 NOTE — ED Triage Notes (Signed)
Pt reports after taking out her contacts she began to "play on my phone... noticed it was watering a lot... then the pain came."  Pt states her left eye is very blurry at this time.  Pt used solutions and wetting drops but the pain is still there.

## 2018-02-20 ENCOUNTER — Encounter (INDEPENDENT_AMBULATORY_CARE_PROVIDER_SITE_OTHER): Payer: 59

## 2018-02-27 ENCOUNTER — Encounter (INDEPENDENT_AMBULATORY_CARE_PROVIDER_SITE_OTHER): Payer: Self-pay | Admitting: Family Medicine

## 2018-02-27 ENCOUNTER — Ambulatory Visit (INDEPENDENT_AMBULATORY_CARE_PROVIDER_SITE_OTHER): Payer: 59 | Admitting: Family Medicine

## 2018-02-27 VITALS — BP 103/68 | HR 75 | Temp 98.1°F | Ht 68.0 in | Wt 204.0 lb

## 2018-02-27 DIAGNOSIS — R5383 Other fatigue: Secondary | ICD-10-CM | POA: Diagnosis not present

## 2018-02-27 DIAGNOSIS — Z0289 Encounter for other administrative examinations: Secondary | ICD-10-CM

## 2018-02-27 DIAGNOSIS — Z6831 Body mass index (BMI) 31.0-31.9, adult: Secondary | ICD-10-CM

## 2018-02-27 DIAGNOSIS — E669 Obesity, unspecified: Secondary | ICD-10-CM

## 2018-02-27 DIAGNOSIS — Z9189 Other specified personal risk factors, not elsewhere classified: Secondary | ICD-10-CM

## 2018-02-27 DIAGNOSIS — R0602 Shortness of breath: Secondary | ICD-10-CM | POA: Diagnosis not present

## 2018-02-27 DIAGNOSIS — Z1331 Encounter for screening for depression: Secondary | ICD-10-CM | POA: Diagnosis not present

## 2018-02-28 NOTE — Progress Notes (Signed)
Office: [phone]  /  Fax: [phone]   Bethany Bethany Rigg, NP,   Thank you for referring Bethany Young to our clinic. The following note includes my evaluation and treatment recommendations.  HPI:   Chief Complaint: OBESITY    Bethany Young has been referred by Bethany Rigg, NP for consultation regarding her obesity and obesity related comorbidities.    Bethany Young (MR# [id number]) is a 23 y.o. female who presents on 02/27/2018 for obesity evaluation and treatment. Current BMI is Body mass index is 31.02 kg/m.Marland Kitchen Bethany Young has been struggling with her weight for many years and has been unsuccessful in either losing weight, maintaining weight loss, or reaching her healthy weight goal.     Bethany Young attended our information session and states she is currently in the action stage of change and ready to dedicate time achieving and maintaining a healthier weight. Bethany Young is interested in becoming our Bethany Young and working on intensive lifestyle modifications including (but not limited to) diet, exercise and weight loss.    Bethany Young states she struggles with family and or coworkers weight loss sabotage her desired weight loss is 29 lbs she has been heavy most of  her life she started gaining weight in 2014-2015 her heaviest weight ever was 225 lbs she has significant food cravings issues  she snacks frequently in the evenings she skips meals frequently she is frequently drinking liquids with calories she frequently makes poor food choices she has problems with excessive hunger  she struggles with emotional eating    Bethany Young feels her energy is lower than it should be. This has worsened with weight gain and has not worsened recently. Bethany Young admits to daytime somnolence and  admits to waking up still tired. Bethany Young is at risk for obstructive sleep apnea. Patent has a history of symptoms of daytime Bethany and morning headache. Bethany Young generally gets 4 hours of sleep per  night, and states they generally have difficulty falling back asleep if awakened. Snoring is not present. Apneic episodes are not present. Epworth Sleepiness Score is 8.  Dyspnea on exertion Bethany Young notes increasing shortness of breath with exercising and seems to be worsening over time with weight gain. She notes getting out of breath sooner with activity than she used to. This has not gotten worse recently. Bethany Young denies orthopnea.  Depression Screen Bethany Young Food and Mood (modified PHQ-9) score was  Depression screen PHQ 2/9 02/27/2018  Decreased Interest 3  Down, Depressed, Hopeless 2  PHQ - 2 Score 5  Altered sleeping 2  Tired, decreased energy 2  Change in appetite 1  Feeling bad or failure about yourself  1  Trouble concentrating 2  Moving slowly or fidgety/restless 0  Suicidal thoughts 0  PHQ-9 Score 13  Difficult doing work/chores Somewhat difficult   At risk for cardiovascular disease Bethany Young is at a higher than average risk for cardiovascular disease due to obesity. She currently denies any chest pain.  ASSESSMENT AND PLAN:  Other Bethany - Plan: EKG 12-Lead, Vitamin B12, CBC With Differential, Folate, Hemoglobin A1c, Insulin, random, T3, T4, free, TSH, VITAMIN D 25 Hydroxy (Vit-D Deficiency, Fractures), Comprehensive metabolic panel  Shortness of breath on exertion - Plan: Lipid Panel With LDL/HDL Ratio  Depression screening  At risk for heart disease  Class 1 obesity with serious comorbidity and body mass index (BMI) of 31.0 to 31.9 in adult, unspecified obesity type  PLAN:  Bethany Young was informed that her Bethany may be related to obesity, depression or many other  causes. Labs will be ordered, and in the meanwhile Bethany Young has agreed to work on diet, exercise and weight loss to help with Bethany. Proper sleep hygiene was discussed including the need for 7-8 hours of quality sleep each night. A sleep study was not ordered based on symptoms and Epworth  score.  Dyspnea on exertion Bethany Young's shortness of breath appears to be obesity related and exercise induced. She has agreed to work on weight loss and gradually increase exercise to treat her exercise induced shortness of breath. If Bethany Young follows our instructions and loses weight without improvement of her shortness of breath, we will plan to refer to pulmonology. We will monitor this condition regularly. Bethany Young agrees to this plan.  Depression Screen Bethany Young had a negative depression screening. Depression is commonly associated with obesity and often results in emotional eating behaviors. We will monitor this closely and work on CBT to help improve the non-hunger eating patterns. Referral to Psychology may be required if no improvement is seen as she continues in our clinic.  Cardiovascular risk counseling Bethany Young was given extended (15 minutes) coronary artery disease prevention counseling today. She is [age] y.o. female and has risk factors for heart disease including obesity. We discussed intensive lifestyle modifications today with an emphasis on specific weight loss instructions and strategies. Pt was also informed of the importance of increasing exercise and decreasing saturated fats to help prevent heart disease.  Obesity Bethany Young is currently in the action stage of change and her goal is to continue with weight loss efforts. I recommend Bethany Young begin the structured treatment plan as follows:  She has agreed to follow the Category 2 plan Bethany Young has been instructed to eventually work up to a goal of 150 minutes of combined cardio and strengthening exercise per week for weight loss and overall health benefits. We discussed the following Behavioral Modification Strategies today: increasing lean protein intake, decreasing simple carbohydrates  and work on meal planning and easy cooking plans   She was informed of the importance of frequent follow up visits to maximize her success with  intensive lifestyle modifications for her multiple health conditions. She was informed we would discuss her lab results at her next visit unless there is a critical issue that needs to be addressed sooner. Bethany Young agreed to keep her next visit at the agreed upon time to discuss these results.  ALLERGIES: Allergies  Allergen Reactions  . Ofloxacin Other (See Comments)    Burns eyes and makes them very irritated and does not help    MEDICATIONS: No current outpatient medications on file prior to visit.   No current facility-administered medications on file prior to visit.     PAST MEDICAL HISTORY: Past Medical History:  Diagnosis Date  . Anemia   . Anxiety   . Back pain   . Chest pain   . Dyspnea   . Bethany   . Insomnia   . Joint pain   . Lower extremity edema     PAST SURGICAL HISTORY: Past Surgical History:  Procedure Laterality Date  . EYELID REPAIR W/ SKIN GRAFT      SOCIAL HISTORY: Social History   Tobacco Use  . Smoking status: Current Every Day Smoker    Types: E-cigarettes  . Smokeless tobacco: Never Used  Substance Use Topics  . Alcohol use: Yes    Comment: weekend drinker  . Drug use: No    Types: Marijuana    FAMILY HISTORY: Family History  Problem Relation Age of Onset  .  Cancer Mother   . Thyroid disease Mother   . Depression Mother   . Anxiety disorder Mother   . Bipolar disorder Mother   . Drug abuse Mother   . Hypertension Father   . Hyperlipidemia Father   . Alcoholism Father   . Drug abuse Father   . Obesity Father     ROS: Review of Systems  Constitutional: Positive for malaise/Bethany. Negative for weight loss.       + Trouble sleeping  HENT: Positive for tinnitus.        + Nasal stuffiness + Decreased hearing  Eyes:       + Wear glasses or contacts  Respiratory: Positive for shortness of breath (with exertion).   Cardiovascular: Negative for chest pain and orthopnea.       + Very cold feet or hands + Chest discomfort +  Chest tightness  Musculoskeletal: Positive for back pain.       + Muscle or joint pain  Skin:       + Dryness  Neurological: Positive for headaches.  Endo/Heme/Allergies: Bruises/bleeds easily.       + Excessive hunger  Psychiatric/Behavioral: Positive for depression. Negative for suicidal ideas.       + Stress    PHYSICAL EXAM: Blood pressure 103/68, pulse 75, temperature 98.1 F (36.7 C), temperature source Oral, height 5\' 8"  (1.727 m), weight 204 lb (92.5 kg), last menstrual period 02/10/2018, SpO2 99 %. Body mass index is 31.02 kg/m. Physical Exam Vitals signs reviewed.  Constitutional:      Appearance: Normal appearance. She is obese.  HENT:     Head: Normocephalic and atraumatic.     Nose: Nose normal.  Eyes:     General: No scleral icterus.    Extraocular Movements: Extraocular movements intact.  Neck:     Musculoskeletal: Normal range of motion and neck supple.     Comments: No thyromegaly present Cardiovascular:     Rate and Rhythm: Normal rate and regular rhythm.     Pulses: Normal pulses.     Heart sounds: Normal heart sounds.  Pulmonary:     Effort: Pulmonary effort is normal. No respiratory distress.     Breath sounds: Normal breath sounds.  Abdominal:     Palpations: Abdomen is soft.     Tenderness: There is no abdominal tenderness.     Comments: + Obesity  Musculoskeletal: Normal range of motion.     Right lower leg: No edema.     Left lower leg: No edema.  Skin:    General: Skin is warm and dry.  Neurological:     Mental Status: She is alert and oriented to person, place, and time.     Coordination: Coordination normal.  Psychiatric:        Mood and Affect: Mood normal.        Behavior: Behavior normal.     RECENT LABS AND TESTS: BMET    Component Value Date/Time   NA 141 02/27/2018 0921   K 4.1 02/27/2018 0921   CL 104 02/27/2018 0921   CO2 21 02/27/2018 0921   GLUCOSE 80 02/27/2018 0921   BUN 8 02/27/2018 0921   CREATININE 0.75  02/27/2018 0921   CALCIUM 9.6 02/27/2018 0921   GFRNONAA 111 02/27/2018 0921   GFRAA 128 02/27/2018 0921   Lab Results  Component Value Date   HGBA1C 5.3 02/27/2018   Lab Results  Component Value Date   INSULIN 25.1 (H) 02/27/2018   CBC  Component Value Date/Time   WBC WILL FOLLOW 02/27/2018 0921   RBC 4.27 02/27/2018 0921   HGB WILL FOLLOW 02/27/2018 0921   HCT WILL FOLLOW 02/27/2018 0921   PLT 345 01/08/2018 1801   MCV 83 02/27/2018 0921   MCH WILL FOLLOW 02/27/2018 0921   MCHC WILL FOLLOW 02/27/2018 0921   RDW WILL FOLLOW 02/27/2018 0921   LYMPHSABS 2.6 02/27/2018 0921   EOSABS 0.4 02/27/2018 0921   BASOSABS 0.1 02/27/2018 0921   Iron/TIBC/Ferritin/ %Sat No results found for: IRON, TIBC, FERRITIN, IRONPCTSAT Lipid Panel     Component Value Date/Time   CHOL 142 02/27/2018 0921   TRIG 46 02/27/2018 0921   HDL 64 02/27/2018 0921   CHOLHDL 2.4 09/15/2017 1007   LDLCALC 69 02/27/2018 0921   Hepatic Function Panel     Component Value Date/Time   PROT 7.1 02/27/2018 0921   ALBUMIN 4.3 02/27/2018 0921   AST 14 02/27/2018 0921   ALT 9 02/27/2018 0921   ALKPHOS 85 02/27/2018 0921   BILITOT 0.2 02/27/2018 0921      Component Value Date/Time   TSH 1.390 02/27/2018 0921   TSH 1.030 04/24/2017 1510    ECG  shows NSR with a rate of 83 BPM INDIRECT CALORIMETER done today shows a VO2 of 216 and a REE of 1505.  Her calculated basal metabolic rate is 6950 thus her basal metabolic rate is worse than expected.       OBESITY BEHAVIORAL INTERVENTION VISIT  Today's visit was # 1   Starting weight: 204 lbs Starting date: 02/27/2018 Today's weight : 204 lbs Today's date: 02/27/2018 Total lbs lost to date: 0    ASK: We discussed the diagnosis of obesity with Darrel Reach today and Tamryn agreed to give [name] permission to discuss obesity behavioral modification therapy today.  ASSESS: Bernece has the diagnosis of obesity and her BMI today is 31.03 Tekeshia is  in the action stage of change   ADVISE: Dreena was educated on the multiple health risks of obesity as well as the benefit of weight loss to improve her health. She was advised of the need for long term treatment and the importance of lifestyle modifications to improve her current health and to decrease her risk of future health problems.  AGREE: Multiple dietary modification options and treatment options were discussed and  Vi agreed to follow the recommendations documented in the above note.  ARRANGE: Tonee was educated on the importance of frequent visits to treat obesity as outlined per CMS and USPSTF guidelines and agreed to schedule her next follow up appointment today.  I, Burt Knack, am acting as transcriptionist for Quillian Quince, MD   I have reviewed the above documentation for accuracy and completeness, and I agree with the above. -Quillian Quince, MD

## 2018-03-01 LAB — COMPREHENSIVE METABOLIC PANEL
ALT: 9 IU/L (ref 0–32)
AST: 14 IU/L (ref 0–40)
Albumin/Globulin Ratio: 1.5 (ref 1.2–2.2)
Albumin: 4.3 g/dL (ref 3.9–5.0)
Alkaline Phosphatase: 85 IU/L (ref 39–117)
BILIRUBIN TOTAL: 0.2 mg/dL (ref 0.0–1.2)
BUN/Creatinine Ratio: 11 (ref 9–23)
BUN: 8 mg/dL (ref 6–20)
CO2: 21 mmol/L (ref 20–29)
Calcium: 9.6 mg/dL (ref 8.7–10.2)
Chloride: 104 mmol/L (ref 96–106)
Creatinine, Ser: 0.75 mg/dL (ref 0.57–1.00)
GFR calc Af Amer: 128 mL/min/{1.73_m2} (ref >59)
GFR calc non Af Amer: 111 mL/min/{1.73_m2} (ref >59)
GLOBULIN, TOTAL: 2.8 g/dL (ref 1.5–4.5)
Glucose: 80 mg/dL (ref 65–99)
Potassium: 4.1 mmol/L (ref 3.5–5.2)
SODIUM: 141 mmol/L (ref 134–144)
Total Protein: 7.1 g/dL (ref 6.0–8.5)

## 2018-03-01 LAB — FOLATE: Folate: 5 ng/mL (ref >3.0)

## 2018-03-01 LAB — T4, FREE: Free T4: 1.27 ng/dL (ref 0.82–1.77)

## 2018-03-01 LAB — CBC WITH DIFFERENTIAL
Basophils Absolute: 0.1 10*3/uL (ref 0.0–0.2)
Basos: 1 %
EOS (ABSOLUTE): 0.4 10*3/uL (ref 0.0–0.4)
Eos: 4 %
Hematocrit: 35.3 % (ref 34.0–46.6)
Hemoglobin: 11.6 g/dL (ref 11.1–15.9)
Immature Grans (Abs): 0 10*3/uL (ref 0.0–0.1)
Immature Granulocytes: 0 %
LYMPHS ABS: 2.6 10*3/uL (ref 0.7–3.1)
Lymphs: 29 %
MCH: 27.2 pg (ref 26.6–33.0)
MCHC: 32.9 g/dL (ref 31.5–35.7)
MCV: 83 fL (ref 79–97)
Monocytes Absolute: 0.6 10*3/uL (ref 0.1–0.9)
Monocytes: 7 %
Neutrophils Absolute: 5.4 10*3/uL (ref 1.4–7.0)
Neutrophils: 59 %
RBC: 4.27 x10E6/uL (ref 3.77–5.28)
RDW: 13.5 % (ref 11.7–15.4)
WBC: 9.1 10*3/uL (ref 3.4–10.8)

## 2018-03-01 LAB — LIPID PANEL WITH LDL/HDL RATIO
Cholesterol, Total: 142 mg/dL (ref 100–199)
HDL: 64 mg/dL (ref >39)
LDL Calculated: 69 mg/dL (ref 0–99)
LDl/HDL Ratio: 1.1 ratio (ref 0.0–3.2)
Triglycerides: 46 mg/dL (ref 0–149)
VLDL Cholesterol Cal: 9 mg/dL (ref 5–40)

## 2018-03-01 LAB — VITAMIN D 25 HYDROXY (VIT D DEFICIENCY, FRACTURES): Vit D, 25-Hydroxy: 13.5 ng/mL — ABNORMAL LOW (ref 30.0–100.0)

## 2018-03-01 LAB — T3: T3 TOTAL: 116 ng/dL (ref 71–180)

## 2018-03-01 LAB — TSH: TSH: 1.39 u[IU]/mL (ref 0.450–4.500)

## 2018-03-01 LAB — HEMOGLOBIN A1C
Est. average glucose Bld gHb Est-mCnc: 105 mg/dL
Hgb A1c MFr Bld: 5.3 % (ref 4.8–5.6)

## 2018-03-01 LAB — VITAMIN B12: Vitamin B-12: >2000 pg/mL — ABNORMAL HIGH (ref 232–1245)

## 2018-03-01 LAB — INSULIN, RANDOM: INSULIN: 25.1 u[IU]/mL — ABNORMAL HIGH (ref 2.6–24.9)

## 2018-03-13 ENCOUNTER — Ambulatory Visit (INDEPENDENT_AMBULATORY_CARE_PROVIDER_SITE_OTHER): Payer: 59 | Admitting: Family Medicine

## 2018-03-13 ENCOUNTER — Encounter (INDEPENDENT_AMBULATORY_CARE_PROVIDER_SITE_OTHER): Payer: Self-pay | Admitting: Family Medicine

## 2018-03-13 VITALS — BP 97/65 | HR 72 | Temp 98.1°F | Ht 68.0 in | Wt 204.0 lb

## 2018-03-13 DIAGNOSIS — E559 Vitamin D deficiency, unspecified: Secondary | ICD-10-CM

## 2018-03-13 DIAGNOSIS — E669 Obesity, unspecified: Secondary | ICD-10-CM | POA: Diagnosis not present

## 2018-03-13 DIAGNOSIS — E8881 Metabolic syndrome: Secondary | ICD-10-CM | POA: Diagnosis not present

## 2018-03-13 DIAGNOSIS — Z9189 Other specified personal risk factors, not elsewhere classified: Secondary | ICD-10-CM

## 2018-03-13 DIAGNOSIS — Z6831 Body mass index (BMI) 31.0-31.9, adult: Secondary | ICD-10-CM

## 2018-03-13 MED ORDER — METFORMIN HCL 500 MG PO TABS: 500.0000 mg | ORAL_TABLET | Freq: Every day | ORAL | 0 refills | Status: DC

## 2018-03-13 MED ORDER — VITAMIN D (ERGOCALCIFEROL) 1.25 MG (50000 UNIT) PO CAPS: 50000.0000 [IU] | ORAL_CAPSULE | ORAL | 0 refills | Status: DC

## 2018-03-13 NOTE — Progress Notes (Signed)
Office: [phone]  /  Fax: [phone]   HPI:   Chief Complaint: OBESITY Bethany Young is here to discuss her progress with her obesity treatment plan. She is on the Category 2 plan and is following her eating plan approximately 50 % of the time. She states she is exercising 0 minutes 0 times per week. Bethany Young states she followed her plan mostly the 1st week, but was "always hungry" even after eating. She went off track for the 2nd week.  Her weight is 204 lb (92.5 kg) today and has not lost weight since her last visit. She has lost 0 lbs since starting treatment with [name].  Vitamin D deficiency Bethany Young has a new diagnosis of vitamin D deficiency. She is not currently taking vit D and she admits fatigue.  Insulin Resistance Bethany Young has a new diagnosis of insulin resistance based on her elevated fasting insulin level of 25.1 on 02/27/18. Although Bethany Young's blood glucose readings are still under good control, insulin resistance puts her at greater risk of metabolic syndrome and diabetes. She is not taking metformin currently and continues to work on diet and exercise to decrease risk of diabetes. Bethany Young notes significant polyphagia and has a strong family history of diabetes.  At risk for diabetes Bethany Young is at higher than average risk for developing diabetes due to her insulin resistance and obesity. She currently denies polyuria or polydipsia.  ASSESSMENT AND PLAN:  Vitamin D deficiency - Plan: Vitamin D, Ergocalciferol, (DRISDOL) 1.25 MG (50000 UT) CAPS capsule  Insulin resistance - Plan: metFORMIN (GLUCOPHAGE) 500 MG tablet  At risk for diabetes mellitus  Class 1 obesity with serious comorbidity and body mass index (BMI) of 31.0 to 31.9 in adult, unspecified obesity type  PLAN:  Vitamin D Deficiency Bethany Young was informed that low vitamin D levels contributes to fatigue and are associated with obesity, breast, and colon cancer. Bethany Young agrees to start to take prescription Vit D  @50 ,000 IU every week #4 with no refills and will follow up for routine testing of vitamin D, at least 2-3 times per year. She was informed of the risk of over-replacement of vitamin D and agrees to not increase her dose unless she discusses this with [name] first. Bethany Young agrees to follow up in 2 weeks as directed.  Insulin Resistance Diary will continue to work on weight loss, exercise, and decreasing simple carbohydrates in her diet to help decrease the risk of diabetes. We discussed metformin including benefits and risks. She was informed that eating too many simple carbohydrates or too many calories at one sitting increases the likelihood of GI side effects. Bethany Young agreed to start metformin 500mg  qAM # 30 with no refills and prescription was written today. Bethany Young agreed to follow up with [name] as directed to monitor her progress.  Diabetes risk counseling Bethany Young was given extended (30 minutes) diabetes prevention counseling today. She is 23 y.o. female and has risk factors for diabetes including obesity. We discussed intensive lifestyle modifications today with an emphasis on weight loss as well as increasing exercise and decreasing simple carbohydrates in her diet.  Obesity Bethany Young is currently in the action stage of change. As such, her goal is to continue with weight loss efforts. She has agreed to follow the Category 2 plan and keep a food journal with 350 to 500 calories and 35+ grams of protein for lunch.  Bethany Young has been instructed to work up to a goal of 150 minutes of combined cardio and strengthening exercise per week for weight loss  and overall health benefits. We discussed the following Behavioral Modification Strategies today: increasing lean protein intake, decreasing simple carbohydrates, and work on meal planning and easy cooking plans.   Bethany Young has agreed to follow up with our clinic in 2 weeks. She was informed of the importance of frequent follow up visits to maximize her  success with intensive lifestyle modifications for her multiple health conditions.  ALLERGIES: Allergies  Allergen Reactions  . Ofloxacin Other (See Comments)    Burns eyes and makes them very irritated and does not help    MEDICATIONS: No current outpatient medications on file prior to visit.   No current facility-administered medications on file prior to visit.     PAST MEDICAL HISTORY: Past Medical History:  Diagnosis Date  . Anemia   . Anxiety   . Back pain   . Chest pain   . Dyspnea   . Fatigue   . Insomnia   . Joint pain   . Lower extremity edema     PAST SURGICAL HISTORY: Past Surgical History:  Procedure Laterality Date  . EYELID REPAIR W/ SKIN GRAFT      SOCIAL HISTORY: Social History   Tobacco Use  . Smoking status: Current Every Day Smoker    Types: E-cigarettes  . Smokeless tobacco: Never Used  Substance Use Topics  . Alcohol use: Yes    Comment: weekend drinker  . Drug use: No    Types: Marijuana    FAMILY HISTORY: Family History  Problem Relation Age of Onset  . Cancer Mother   . Thyroid disease Mother   . Depression Mother   . Anxiety disorder Mother   . Bipolar disorder Mother   . Drug abuse Mother   . Hypertension Father   . Hyperlipidemia Father   . Alcoholism Father   . Drug abuse Father   . Obesity Father     ROS: Review of Systems  Constitutional: Positive for malaise/fatigue. Negative for weight loss.  Genitourinary:       Negative for polyuria.  Endo/Heme/Allergies: Negative for polydipsia.       Positive for polyphagia.    PHYSICAL EXAM: Blood pressure 97/65, pulse 72, temperature 98.1 F (36.7 C), temperature source Oral, height 5\' 8"  (1.727 m), weight 204 lb (92.5 kg), SpO2 100 %. Body mass index is 31.02 kg/m. Physical Exam Vitals signs reviewed.  Constitutional:      Appearance: Normal appearance. She is obese.  Cardiovascular:     Rate and Rhythm: Normal rate.  Pulmonary:     Effort: Pulmonary effort is  normal.  Musculoskeletal: Normal range of motion.  Skin:    General: Skin is warm and dry.  Neurological:     Mental Status: She is alert and oriented to person, place, and time.  Psychiatric:        Mood and Affect: Mood normal.        Behavior: Behavior normal.     RECENT LABS AND TESTS: BMET    Component Value Date/Time   NA 141 02/27/2018 0921   K 4.1 02/27/2018 0921   CL 104 02/27/2018 0921   CO2 21 02/27/2018 0921   GLUCOSE 80 02/27/2018 0921   BUN 8 02/27/2018 0921   CREATININE 0.75 02/27/2018 0921   CALCIUM 9.6 02/27/2018 0921   GFRNONAA 111 02/27/2018 0921   GFRAA 128 02/27/2018 0921   Lab Results  Component Value Date   HGBA1C 5.3 02/27/2018   Lab Results  Component Value Date   INSULIN 25.1 (H)  02/27/2018   CBC    Component Value Date/Time   WBC 9.1 02/27/2018 0921   RBC 4.27 02/27/2018 0921   HGB 11.6 02/27/2018 0921   HCT 35.3 02/27/2018 0921   PLT 345 01/08/2018 1801   MCV 83 02/27/2018 0921   MCH 27.2 02/27/2018 0921   MCHC 32.9 02/27/2018 0921   RDW 13.5 02/27/2018 0921   LYMPHSABS 2.6 02/27/2018 0921   EOSABS 0.4 02/27/2018 0921   BASOSABS 0.1 02/27/2018 0921   Iron/TIBC/Ferritin/ %Sat No results found for: IRON, TIBC, FERRITIN, IRONPCTSAT Lipid Panel     Component Value Date/Time   CHOL 142 02/27/2018 0921   TRIG 46 02/27/2018 0921   HDL 64 02/27/2018 0921   CHOLHDL 2.4 09/15/2017 1007   LDLCALC 69 02/27/2018 0921   Hepatic Function Panel     Component Value Date/Time   PROT 7.1 02/27/2018 0921   ALBUMIN 4.3 02/27/2018 0921   AST 14 02/27/2018 0921   ALT 9 02/27/2018 0921   ALKPHOS 85 02/27/2018 0921   BILITOT 0.2 02/27/2018 0921      Component Value Date/Time   TSH 1.390 02/27/2018 0921   TSH 1.030 04/24/2017 1510   Results for ELISKA, SEEHAFER (MRN [id number]) as of 03/13/2018 09:42  Ref. Range 02/27/2018 09:21  Vitamin D, 25-Hydroxy Latest Ref Range: 30.0 - 100.0 ng/mL 13.5 (L)   OBESITY BEHAVIORAL INTERVENTION  VISIT  Today's visit was # 2   Starting weight: 204 lbs Starting date: 02/27/18 Today's weight : Weight: 204 lb (92.5 kg)  Today's date: 03/13/2018 Total lbs lost to date: 0   03/13/2018  Height 5\' 8"  (1.727 m)  Weight 204 lb (92.5 kg)  BMI (Calculated) 31.03  BLOOD PRESSURE - SYSTOLIC 97  BLOOD PRESSURE - DIASTOLIC 65   Body Fat % 39.8 %  Total Body Water (lbs) 83.6 lbs    ASK: We discussed the diagnosis of obesity with Bethany Young today and Bethany Young agreed to give [name] permission to discuss obesity behavioral modification therapy today.  ASSESS: Bethany Young has the diagnosis of obesity and her BMI today is 31.03. Bethany Young is in the action stage of change.   ADVISE: Bethany Young was educated on the multiple health risks of obesity as well as the benefit of weight loss to improve her health. She was advised of the need for long term treatment and the importance of lifestyle modifications to improve her current health and to decrease her risk of future health problems.  AGREE: Multiple dietary modification options and treatment options were discussed and Bethany Young agreed to follow the recommendations documented in the above note.  ARRANGE: Bethany Young was educated on the importance of frequent visits to treat obesity as outlined per CMS and USPSTF guidelines and agreed to schedule her next follow up appointment today.  IKirke Corin, CMA, am acting as transcriptionist for Wilder Glade, MD  I have reviewed the above documentation for accuracy and completeness, and I agree with the above. -Quillian Quince, MD

## 2018-03-28 ENCOUNTER — Encounter (INDEPENDENT_AMBULATORY_CARE_PROVIDER_SITE_OTHER): Payer: Self-pay | Admitting: Physician Assistant

## 2018-03-28 ENCOUNTER — Other Ambulatory Visit: Payer: Self-pay

## 2018-03-28 ENCOUNTER — Ambulatory Visit (INDEPENDENT_AMBULATORY_CARE_PROVIDER_SITE_OTHER): Payer: 59 | Admitting: Physician Assistant

## 2018-03-28 VITALS — BP 98/65 | HR 78 | Temp 98.4°F | Ht 68.0 in | Wt 198.0 lb

## 2018-03-28 DIAGNOSIS — E559 Vitamin D deficiency, unspecified: Secondary | ICD-10-CM | POA: Diagnosis not present

## 2018-03-28 DIAGNOSIS — E669 Obesity, unspecified: Secondary | ICD-10-CM | POA: Diagnosis not present

## 2018-03-28 DIAGNOSIS — E8881 Metabolic syndrome: Secondary | ICD-10-CM

## 2018-03-28 DIAGNOSIS — Z9189 Other specified personal risk factors, not elsewhere classified: Secondary | ICD-10-CM | POA: Diagnosis not present

## 2018-03-28 DIAGNOSIS — Z6830 Body mass index (BMI) 30.0-30.9, adult: Secondary | ICD-10-CM

## 2018-03-28 MED ORDER — VITAMIN D (ERGOCALCIFEROL) 1.25 MG (50000 UNIT) PO CAPS: 50000.0000 [IU] | ORAL_CAPSULE | ORAL | 0 refills | Status: DC

## 2018-03-28 MED ORDER — METFORMIN HCL 500 MG PO TABS: 500.0000 mg | ORAL_TABLET | Freq: Every day | ORAL | 0 refills | Status: DC

## 2018-03-28 NOTE — Progress Notes (Signed)
Office: [phone]  /  Fax: [phone]   HPI:   Chief Complaint: OBESITY Bethany Young is here to discuss her progress with her obesity treatment plan. She is on the Category 2 plan with lunch journaling 350-500 calories + 35 grams of protein and is following her eating plan approximately 98% of the time. She states she is exercising 0 minutes 0 times per week. [name] did very well with weight loss. She reports following the plan much more closely recently. She states that the metformin, which was started 2 weeks ago, has helped decrease her hunger. Her weight is 198 lb (89.8 kg) today and has had a weight loss of 6 pounds over a period of 2 weeks since her last visit. She has lost 6 lbs since starting treatment with [name].  Vitamin D deficiency Bethany Young has a diagnosis of Vitamin D deficiency. She is currently taking prescription Vit D and denies nausea, vomiting or muscle weakness.  At risk for osteopenia and osteoporosis Bethany Young is at higher risk of osteopenia and osteoporosis due to Vitamin D deficiency.   Insulin Resistance Bethany Young has a diagnosis of insulin resistance based on her elevated fasting insulin level >5. Although Bethany Young's blood glucose readings are still under good control, insulin resistance puts her at greater risk of metabolic syndrome and diabetes. She is taking metformin currently and continues to work on diet and exercise to decrease risk of diabetes. She does report some nausea and 2 days of diarrhea after eating fried food.  ASSESSMENT AND PLAN:  Vitamin D deficiency - Plan: Vitamin D, Ergocalciferol, (DRISDOL) 1.25 MG (50000 UT) CAPS capsule  Insulin resistance - Plan: metFORMIN (GLUCOPHAGE) 500 MG tablet  At risk for osteoporosis  Class 1 obesity with serious comorbidity and body mass index (BMI) of 30.0 to 30.9 in adult, unspecified obesity type  PLAN:  Vitamin D Deficiency Bethany Young was informed that low Vitamin D levels contributes to fatigue and are  associated with obesity, breast, and colon cancer. She agrees to continue to take prescription Vit D @ 50,000 IU every week #4 with 0 refills and will follow-up for routine testing of Vitamin D, at least 2-3 times per year. She was informed of the risk of over-replacement of Vitamin D and agrees to not increase her dose unless she discusses this with [name] first. Bethany Young agrees to follow-up with our clinic in 2 weeks.  At risk for osteopenia and osteoporosis Bethany Young was given extended  (15 minutes) osteoporosis prevention counseling today. [name] is at risk for osteopenia and osteoporsis due to her Vitamin D deficiency. She was encouraged to take her Vitamin D and follow her higher calcium diet and increase strengthening exercise to help strengthen her bones and decrease her risk of osteopenia and osteoporosis.  Insulin Resistance Bethany Young will continue to work on weight loss, exercise, and decreasing simple carbohydrates in her diet to help decrease the risk of diabetes. We dicussed metformin including benefits and risks. She was informed that eating too many simple carbohydrates or too many calories at one sitting increases the likelihood of GI side effects. Bethany Young is currently on metformin and a refill prescription was written today for #30 with 0 refills. Bethany Young agrees to follow-up with our clinic in 2 weeks.  Obesity Bethany Young is currently in the action stage of change. As such, her goal is to continue with weight loss efforts. She has agreed to follow the Category 2 plan and journal 350-500 calories + 35 grams of protein at lunch. Bethany Young has been instructed to  work up to a goal of 150 minutes of combined cardio and strengthening exercise per week for weight loss and overall health benefits. We discussed the following Behavioral Modification Strategies today: work on meal planning and easy cooking plans and planning for success.  Bethany Young has agreed to follow-up with our clinic in 2 weeks. She was  informed of the importance of frequent follow-up visits to maximize her success with intensive lifestyle modifications for her multiple health conditions.  ALLERGIES: Allergies  Allergen Reactions   Ofloxacin Other (See Comments)    Burns eyes and makes them very irritated and does not help    MEDICATIONS: Current Outpatient Medications on File Prior to Visit  Medication Sig Dispense Refill   metFORMIN (GLUCOPHAGE) 500 MG tablet Take 1 tablet (500 mg total) by mouth daily with breakfast. 30 tablet 0   Vitamin D, Ergocalciferol, (DRISDOL) 1.25 MG (50000 UT) CAPS capsule Take 1 capsule (50,000 Units total) by mouth every 7 (seven) days. 4 capsule 0   No current facility-administered medications on file prior to visit.     PAST MEDICAL HISTORY: Past Medical History:  Diagnosis Date   Anemia    Anxiety    Back pain    Chest pain    Dyspnea    Fatigue    Insomnia    Joint pain    Lower extremity edema     PAST SURGICAL HISTORY: Past Surgical History:  Procedure Laterality Date   EYELID REPAIR W/ SKIN GRAFT      SOCIAL HISTORY: Social History   Tobacco Use   Smoking status: Current Every Day Smoker    Types: E-cigarettes   Smokeless tobacco: Never Used  Substance Use Topics   Alcohol use: Yes    Comment: weekend drinker   Drug use: No    Types: Marijuana    FAMILY HISTORY: Family History  Problem Relation Age of Onset   Cancer Mother    Thyroid disease Mother    Depression Mother    Anxiety disorder Mother    Bipolar disorder Mother    Drug abuse Mother    Hypertension Father    Hyperlipidemia Father    Alcoholism Father    Drug abuse Father    Obesity Father    ROS: Review of Systems  Constitutional: Positive for weight loss.  Gastrointestinal: Negative for nausea and vomiting.  Musculoskeletal:       Negative for muscle weakness.  Endo/Heme/Allergies:       Negative for hypoglycemia.   PHYSICAL EXAM: Blood pressure  98/65, pulse 78, temperature 98.4 F (36.9 C), temperature source Oral, height 5\' 8"  (1.727 m), weight 198 lb (89.8 kg), SpO2 93 %. Body mass index is 30.11 kg/m. Physical Exam Vitals signs reviewed.  Constitutional:      Appearance: Normal appearance. She is obese.  Cardiovascular:     Rate and Rhythm: Normal rate.     Pulses: Normal pulses.  Pulmonary:     Effort: Pulmonary effort is normal.     Breath sounds: Normal breath sounds.  Musculoskeletal: Normal range of motion.  Skin:    General: Skin is warm and dry.  Neurological:     Mental Status: She is alert and oriented to person, place, and time.  Psychiatric:        Behavior: Behavior normal.   RECENT LABS AND TESTS: BMET    Component Value Date/Time   NA 141 02/27/2018 0921   K 4.1 02/27/2018 0921   CL 104 02/27/2018 6659  CO2 21 02/27/2018 0921   GLUCOSE 80 02/27/2018 0921   BUN 8 02/27/2018 0921   CREATININE 0.75 02/27/2018 0921   CALCIUM 9.6 02/27/2018 0921   GFRNONAA 111 02/27/2018 0921   GFRAA 128 02/27/2018 0921   Lab Results  Component Value Date   HGBA1C 5.3 02/27/2018   Lab Results  Component Value Date   INSULIN 25.1 (H) 02/27/2018   CBC    Component Value Date/Time   WBC 9.1 02/27/2018 0921   RBC 4.27 02/27/2018 0921   HGB 11.6 02/27/2018 0921   HCT 35.3 02/27/2018 0921   PLT 345 01/08/2018 1801   MCV 83 02/27/2018 0921   MCH 27.2 02/27/2018 0921   MCHC 32.9 02/27/2018 0921   RDW 13.5 02/27/2018 0921   LYMPHSABS 2.6 02/27/2018 0921   EOSABS 0.4 02/27/2018 0921   BASOSABS 0.1 02/27/2018 0921   Iron/TIBC/Ferritin/ %Sat No results found for: IRON, TIBC, FERRITIN, IRONPCTSAT Lipid Panel     Component Value Date/Time   CHOL 142 02/27/2018 0921   TRIG 46 02/27/2018 0921   HDL 64 02/27/2018 0921   CHOLHDL 2.4 09/15/2017 1007   LDLCALC 69 02/27/2018 0921   Hepatic Function Panel     Component Value Date/Time   PROT 7.1 02/27/2018 0921   ALBUMIN 4.3 02/27/2018 0921   AST 14  02/27/2018 0921   ALT 9 02/27/2018 0921   ALKPHOS 85 02/27/2018 0921   BILITOT 0.2 02/27/2018 0921      Component Value Date/Time   TSH 1.390 02/27/2018 0921   TSH 1.030 04/24/2017 1510   Results for DAIZA, PRITZL (MRN [id number]) as of 03/28/2018 12:00  Ref. Range 02/27/2018 09:21  Vitamin D, 25-Hydroxy Latest Ref Range: 30.0 - 100.0 ng/mL 13.5 (L)   OBESITY BEHAVIORAL INTERVENTION VISIT  Today's visit was #3   Starting weight: 204 lbs Starting date: 02/27/2018 Today's weight: 198 lbs  Today's date: 03/28/2018 Total lbs lost to date: 6    03/28/2018  Height 5\' 8"  (1.727 m)  Weight 198 lb (89.8 kg)  BMI (Calculated) 30.11  BLOOD PRESSURE - SYSTOLIC 98  BLOOD PRESSURE - DIASTOLIC 65   Body Fat % 38.9 %  Total Body Water (lbs) 81 lbs   ASK: We discussed the diagnosis of obesity with Darrel Reach today and Allye agreed to give [name] permission to discuss obesity behavioral modification therapy today.  ASSESS: Ara has the diagnosis of obesity and her BMI today is 30.11. Emilyah is in the action stage of change.   ADVISE: Shondi was educated on the multiple health risks of obesity as well as the benefit of weight loss to improve her health. She was advised of the need for long term treatment and the importance of lifestyle modifications to improve her current health and to decrease her risk of future health problems.  AGREE: Multiple dietary modification options and treatment options were discussed and  Jesa agreed to follow the recommendations documented in the above note.  ARRANGE: Kimberlly was educated on the importance of frequent visits to treat obesity as outlined per CMS and USPSTF guidelines and agreed to schedule her next follow up appointment today.  Fernanda Drum, am acting as transcriptionist for Alois Cliche, PA-C I, Alois Cliche, PA-C have reviewed above note and agree with its content

## 2018-04-11 ENCOUNTER — Encounter (INDEPENDENT_AMBULATORY_CARE_PROVIDER_SITE_OTHER): Payer: Self-pay

## 2018-04-16 ENCOUNTER — Encounter (INDEPENDENT_AMBULATORY_CARE_PROVIDER_SITE_OTHER): Payer: Self-pay | Admitting: Physician Assistant

## 2018-04-16 ENCOUNTER — Ambulatory Visit (INDEPENDENT_AMBULATORY_CARE_PROVIDER_SITE_OTHER): Payer: 59 | Admitting: Physician Assistant

## 2018-04-16 ENCOUNTER — Other Ambulatory Visit: Payer: Self-pay

## 2018-04-16 DIAGNOSIS — E669 Obesity, unspecified: Secondary | ICD-10-CM | POA: Diagnosis not present

## 2018-04-16 DIAGNOSIS — E559 Vitamin D deficiency, unspecified: Secondary | ICD-10-CM | POA: Diagnosis not present

## 2018-04-16 DIAGNOSIS — Z6830 Body mass index (BMI) 30.0-30.9, adult: Secondary | ICD-10-CM

## 2018-04-17 NOTE — Progress Notes (Addendum)
Office: [phone]  /  Fax: [phone] TeleHealth Visit:  Bethany Young has consented to this TeleHealth visit today via FaceTime. The patient is located at home, the provider is located at the [facility] office. The participants in this visit include the listed provider and patient and any and all parties involved.   HPI:   Chief Complaint: OBESITY Bethany Young is here to discuss her progress with her obesity treatment plan. She is on the keep a food journal with 350 to 500 calories and 35 grams of protein at lunch daily and the Category 2 plan and is following her eating plan approximately 0 % of the time. She states she is exercising 0 minutes 0 times per week. Bethany Young that she has gotten off track with her eating, due to feeding all the members of her family. She is eating a lot of simple carbohydrates. Bethany Young states that she is moving into her own place in two days. We were unable to weight the patient today for this TeleHealth visit.She feels as if she has gained 7 pounds since her last visit. She has lost 6 lbs since starting treatment with [name].  Vitamin D deficiency Bethany Young has a diagnosis of vitamin D deficiency. She is currently taking vit D and denies nausea, vomiting or muscle weakness.  ASSESSMENT AND PLAN:  Vitamin D deficiency  Class 1 obesity with serious comorbidity and body mass index (BMI) of 30.0 to 30.9 in adult, unspecified obesity type  PLAN:  Vitamin D Deficiency Bethany Young was informed that low vitamin D levels contributes to fatigue and are associated with obesity, breast, and colon cancer. She agrees to continue to take prescription Vit D @50 ,000 IU every week and will follow up for routine testing of vitamin D, at least 2-3 times per year. She was informed of the risk of over-replacement of vitamin D and agrees to not increase her dose unless she discusses this with [name] first.  I spent > than 50% of the 15 minute visit on counseling as  documented in the note.  Obesity Bethany Young is currently in the action stage of change. As such, her goal is to continue with weight loss efforts She has agreed to keep a food journal with 1200 calories and 90 grams of protein or follow the Category 2 plan Bethany Young has been instructed to work up to a goal of 150 minutes of combined cardio and strengthening exercise per week for weight loss and overall health benefits. We discussed the following Behavioral Modification Strategies today: keeping healthy foods in the home and work on meal planning and easy cooking plans  Bethany Young has agreed to follow up with our clinic in 2 weeks. She was informed of the importance of frequent follow up visits to maximize her success with intensive lifestyle modifications for her multiple health conditions.  ALLERGIES: Allergies  Allergen Reactions  . Ofloxacin Other (See Comments)    Burns eyes and makes them very irritated and does not help    MEDICATIONS: Current Outpatient Medications on File Prior to Visit  Medication Sig Dispense Refill  . metFORMIN (GLUCOPHAGE) 500 MG tablet Take 1 tablet (500 mg total) by mouth daily with breakfast. 30 tablet 0  . Vitamin D, Ergocalciferol, (DRISDOL) 1.25 MG (50000 UT) CAPS capsule Take 1 capsule (50,000 Units total) by mouth every 7 (seven) days. 4 capsule 0   No current facility-administered medications on file prior to visit.     PAST MEDICAL HISTORY: Past Medical History:  Diagnosis Date  .  Anemia   . Anxiety   . Back pain   . Chest pain   . Dyspnea   . Fatigue   . Insomnia   . Joint pain   . Lower extremity edema     PAST SURGICAL HISTORY: Past Surgical History:  Procedure Laterality Date  . EYELID REPAIR W/ SKIN GRAFT      SOCIAL HISTORY: Social History   Tobacco Use  . Smoking status: Current Every Day Smoker    Types: E-cigarettes  . Smokeless tobacco: Never Used  Substance Use Topics  . Alcohol use: Yes    Comment: weekend drinker  .  Drug use: No    Types: Marijuana    FAMILY HISTORY: Family History  Problem Relation Age of Onset  . Cancer Mother   . Thyroid disease Mother   . Depression Mother   . Anxiety disorder Mother   . Bipolar disorder Mother   . Drug abuse Mother   . Hypertension Father   . Hyperlipidemia Father   . Alcoholism Father   . Drug abuse Father   . Obesity Father     ROS: Review of Systems  Constitutional: Negative for weight loss.  Gastrointestinal: Negative for nausea and vomiting.  Musculoskeletal:       Negative for muscle weakness    PHYSICAL EXAM: Pt in no acute distress  RECENT LABS AND TESTS: BMET    Component Value Date/Time   NA 141 02/27/2018 0921   K 4.1 02/27/2018 0921   CL 104 02/27/2018 0921   CO2 21 02/27/2018 0921   GLUCOSE 80 02/27/2018 0921   BUN 8 02/27/2018 0921   CREATININE 0.75 02/27/2018 0921   CALCIUM 9.6 02/27/2018 0921   GFRNONAA 111 02/27/2018 0921   GFRAA 128 02/27/2018 0921   Lab Results  Component Value Date   HGBA1C 5.3 02/27/2018   Lab Results  Component Value Date   INSULIN 25.1 (H) 02/27/2018   CBC    Component Value Date/Time   WBC 9.1 02/27/2018 0921   RBC 4.27 02/27/2018 0921   HGB 11.6 02/27/2018 0921   HCT 35.3 02/27/2018 0921   PLT 345 01/08/2018 1801   MCV 83 02/27/2018 0921   MCH 27.2 02/27/2018 0921   MCHC 32.9 02/27/2018 0921   RDW 13.5 02/27/2018 0921   LYMPHSABS 2.6 02/27/2018 0921   EOSABS 0.4 02/27/2018 0921   BASOSABS 0.1 02/27/2018 0921   Iron/TIBC/Ferritin/ %Sat No results found for: IRON, TIBC, FERRITIN, IRONPCTSAT Lipid Panel     Component Value Date/Time   CHOL 142 02/27/2018 0921   TRIG 46 02/27/2018 0921   HDL 64 02/27/2018 0921   CHOLHDL 2.4 09/15/2017 1007   LDLCALC 69 02/27/2018 0921   Hepatic Function Panel     Component Value Date/Time   PROT 7.1 02/27/2018 0921   ALBUMIN 4.3 02/27/2018 0921   AST 14 02/27/2018 0921   ALT 9 02/27/2018 0921   ALKPHOS 85 02/27/2018 0921   BILITOT  0.2 02/27/2018 0921      Component Value Date/Time   TSH 1.390 02/27/2018 0921   TSH 1.030 04/24/2017 1510     Ref. Range 02/27/2018 09:21  Vitamin D, 25-Hydroxy Latest Ref Range: 30.0 - 100.0 ng/mL 13.5 (L)     I, [place] Crane, am acting as transcriptionist for Alois Cliche, PA-CI, Alois Cliche, PA-C have reviewed above note and agree with its content

## 2018-04-19 ENCOUNTER — Encounter (INDEPENDENT_AMBULATORY_CARE_PROVIDER_SITE_OTHER): Payer: Self-pay | Admitting: Family Medicine

## 2018-04-19 NOTE — Telephone Encounter (Signed)
Please advise. Pt next appt is 05/01/18. Jeralene Peters, LPN

## 2018-04-20 ENCOUNTER — Encounter: Payer: Self-pay | Admitting: Nurse Practitioner

## 2018-04-20 ENCOUNTER — Other Ambulatory Visit: Payer: Self-pay | Admitting: Nurse Practitioner

## 2018-04-20 MED ORDER — HYDROCORTISONE 1 % EX CREA: 1.0000 "application " | TOPICAL_CREAM | Freq: Two times a day (BID) | CUTANEOUS | 0 refills | Status: DC

## 2018-04-20 MED ORDER — DIPHENHYDRAMINE HCL 25 MG PO CAPS: 25.0000 mg | ORAL_CAPSULE | Freq: Four times a day (QID) | ORAL | 0 refills | Status: DC | PRN

## 2018-04-20 NOTE — Telephone Encounter (Signed)
Patient mychart concern.

## 2018-04-28 ENCOUNTER — Other Ambulatory Visit (INDEPENDENT_AMBULATORY_CARE_PROVIDER_SITE_OTHER): Payer: Self-pay | Admitting: Physician Assistant

## 2018-04-28 DIAGNOSIS — E559 Vitamin D deficiency, unspecified: Secondary | ICD-10-CM

## 2018-04-30 ENCOUNTER — Other Ambulatory Visit (INDEPENDENT_AMBULATORY_CARE_PROVIDER_SITE_OTHER): Payer: Self-pay | Admitting: Physician Assistant

## 2018-04-30 DIAGNOSIS — E8881 Metabolic syndrome: Secondary | ICD-10-CM

## 2018-05-01 ENCOUNTER — Encounter (INDEPENDENT_AMBULATORY_CARE_PROVIDER_SITE_OTHER): Payer: Self-pay | Admitting: Physician Assistant

## 2018-05-01 ENCOUNTER — Ambulatory Visit (INDEPENDENT_AMBULATORY_CARE_PROVIDER_SITE_OTHER): Payer: 59 | Admitting: Physician Assistant

## 2018-05-01 ENCOUNTER — Other Ambulatory Visit: Payer: Self-pay

## 2018-05-01 DIAGNOSIS — E559 Vitamin D deficiency, unspecified: Secondary | ICD-10-CM | POA: Diagnosis not present

## 2018-05-01 DIAGNOSIS — E669 Obesity, unspecified: Secondary | ICD-10-CM | POA: Diagnosis not present

## 2018-05-01 DIAGNOSIS — Z6830 Body mass index (BMI) 30.0-30.9, adult: Secondary | ICD-10-CM | POA: Diagnosis not present

## 2018-05-01 DIAGNOSIS — E8881 Metabolic syndrome: Secondary | ICD-10-CM | POA: Diagnosis not present

## 2018-05-01 MED ORDER — METFORMIN HCL 500 MG PO TABS: 500.0000 mg | ORAL_TABLET | Freq: Every day | ORAL | 0 refills | Status: DC

## 2018-05-01 MED ORDER — VITAMIN D (ERGOCALCIFEROL) 1.25 MG (50000 UNIT) PO CAPS: 50000.0000 [IU] | ORAL_CAPSULE | ORAL | 0 refills | Status: DC

## 2018-05-01 NOTE — Progress Notes (Signed)
Office: [phone]  /  Fax: [phone] TeleHealth Visit:  Bethany ReachJessica Young has verbally consented to this TeleHealth visit today. The patient is located at home, the provider is located at the [facility] office. The participants in this visit include the listed provider and patient. The visit was conducted today via Webex.  HPI:   Chief Complaint: OBESITY Bethany Young is here to discuss her progress with her obesity treatment plan. She is on the Category 2 plan or journaling 1200 calories + 90 grams of protein and is following her eating plan approximately 25% of the time. She states she is exercising 0 minutes 0 times per week. Bethany Young reports that she just moved into a new place and has not been eating. She is ready to start cooking again and would like more freedom with her plan. We were unable to weigh the patient today for this TeleHealth visit. She feels as if she has maintained her weight since her last visit. She has lost 6 lbs since starting treatment with us.  Vitamin D deficiency Bethany Young has a diagnosis of Vitamin D deficiency. She is currently taking prescription Vit D and denies nausea, vomiting or muscle weakness.  Insulin Resistance Bethany Young has a diagnosis of insulin resistance based on her elevated fasting insulin level >5. Although Chinmayi's blood glucose readings are still under good control, insulin resistance puts her at greater risk of metabolic syndrome and diabetes. She is taking metformin currently and continues to work on diet and exercise to decrease risk of diabetes. No nausea, vomiting, or diarrhea on metformin. No polyphagia.  ASSESSMENT AND PLAN:  Vitamin D deficiency - Plan: Vitamin D, Ergocalciferol, (DRISDOL) 1.25 MG (50000 UT) CAPS capsule  Insulin resistance - Plan: metFORMIN (GLUCOPHAGE) 500 MG tablet  Class 1 obesity with serious comorbidity and body mass index (BMI) of 30.0 to 30.9 in adult, unspecified obesity type  PLAN:  Vitamin D  Deficiency Bethany Young was informed that low Vitamin D levels contributes to fatigue and are associated with obesity, breast, and colon cancer. She agrees to continue to take prescription Vit D @ 50,000 IU every week #4 with 0 refills and will follow-up for routine testing of Vitamin D, at least 2-3 times per year. She was informed of the risk of over-replacement of Vitamin D and agrees to not increase her dose unless she discusses this with us first. Bethany Young agrees to follow-up with our clinic in 2 weeks.  Insulin Resistance Bethany Young will continue to work on weight loss, exercise, and decreasing simple carbohydrates in her diet to help decrease the risk of diabetes. We dicussed metformin including benefits and risks. She was informed that eating too many simple carbohydrates or too many calories at one sitting increases the likelihood of GI side effects. Bethany Young is currently taking metformin and a refill prescription was written today for #30 with 0 refills. Bethany Young agrees to follow-up with our clinic in 2 weeks.  Obesity Bethany Young is currently in the action stage of change. As such, her goal is to continue with weight loss efforts. She has agreed to only keeping a food journal with 1200 calories and 85 grams of protein daily. Bethany Young has been instructed to work up to a goal of 150 minutes of combined cardio and strengthening exercise per week for weight loss and overall health benefits. We discussed the following Behavioral Modification Strategies today: no skipping meals, work on meal planning, and easy cooking plans.  Bethany Young has agreed to follow-up with our clinic in 2 weeks. She  was informed of the importance of frequent follow-up visits to maximize her success with intensive lifestyle modifications for her multiple health conditions.  ALLERGIES: Allergies  Allergen Reactions  . Ofloxacin Other (See Comments)    Burns eyes and makes them very irritated and does not help    MEDICATIONS: Current  Outpatient Medications on File Prior to Visit  Medication Sig Dispense Refill  . diphenhydrAMINE (BENADRYL ALLERGY) 25 mg capsule Take 1 capsule (25 mg total) by mouth every 6 (six) hours as needed. 30 capsule 0  . hydrocortisone cream 1 % Apply 1 application topically 2 (two) times daily. 30 g 0   No current facility-administered medications on file prior to visit.     PAST MEDICAL HISTORY: Past Medical History:  Diagnosis Date  . Anemia   . Anxiety   . Back pain   . Chest pain   . Dyspnea   . Fatigue   . Insomnia   . Joint pain   . Lower extremity edema     PAST SURGICAL HISTORY: Past Surgical History:  Procedure Laterality Date  . EYELID REPAIR W/ SKIN GRAFT      SOCIAL HISTORY: Social History   Tobacco Use  . Smoking status: Current Every Day Smoker    Types: E-cigarettes  . Smokeless tobacco: Never Used  Substance Use Topics  . Alcohol use: Yes    Comment: weekend drinker  . Drug use: No    Types: Marijuana    FAMILY HISTORY: Family History  Problem Relation Age of Onset  . Cancer Mother   . Thyroid disease Mother   . Depression Mother   . Anxiety disorder Mother   . Bipolar disorder Mother   . Drug abuse Mother   . Hypertension Father   . Hyperlipidemia Father   . Alcoholism Father   . Drug abuse Father   . Obesity Father    ROS: Review of Systems  Gastrointestinal: Negative for diarrhea, nausea and vomiting.  Musculoskeletal:       Negative for muscle weakness.  Endo/Heme/Allergies:       Negative for polyphagia.   PHYSICAL EXAM: Pt in no acute distress  RECENT LABS AND TESTS: BMET    Component Value Date/Time   NA 141 02/27/2018 0921   K 4.1 02/27/2018 0921   CL 104 02/27/2018 0921   CO2 21 02/27/2018 0921   GLUCOSE 80 02/27/2018 0921   BUN 8 02/27/2018 0921   CREATININE 0.75 02/27/2018 0921   CALCIUM 9.6 02/27/2018 0921   GFRNONAA 111 02/27/2018 0921   GFRAA 128 02/27/2018 0921   Lab Results  Component Value Date   HGBA1C  5.3 02/27/2018   Lab Results  Component Value Date   INSULIN 25.1 (H) 02/27/2018   CBC    Component Value Date/Time   WBC 9.1 02/27/2018 0921   RBC 4.27 02/27/2018 0921   HGB 11.6 02/27/2018 0921   HCT 35.3 02/27/2018 0921   PLT 345 01/08/2018 1801   MCV 83 02/27/2018 0921   MCH 27.2 02/27/2018 0921   MCHC 32.9 02/27/2018 0921   RDW 13.5 02/27/2018 0921   LYMPHSABS 2.6 02/27/2018 0921   EOSABS 0.4 02/27/2018 0921   BASOSABS 0.1 02/27/2018 0921   Iron/TIBC/Ferritin/ %Sat No results found for: IRON, TIBC, FERRITIN, IRONPCTSAT Lipid Panel     Component Value Date/Time   CHOL 142 02/27/2018 0921   TRIG 46 02/27/2018 0921   HDL 64 02/27/2018 0921   CHOLHDL 2.4 09/15/2017 1007   LDLCALC 69 02/27/2018  [id number]   Hepatic Function Panel     Component Value Date/Time   PROT 7.1 02/27/2018 0921   ALBUMIN 4.3 02/27/2018 0921   AST 14 02/27/2018 0921   ALT 9 02/27/2018 0921   ALKPHOS 85 02/27/2018 0921   BILITOT 0.2 02/27/2018 0921      Component Value Date/Time   TSH 1.390 02/27/2018 0921   TSH 1.030 04/24/2017 1510   Results for LEALANI, PRIMERANO (MRN [id number]) as of 05/01/2018 15:15  Ref. Range 02/27/2018 09:21  Vitamin D, 25-Hydroxy Latest Ref Range: 30.0 - 100.0 ng/mL 13.5 (L)   I, Marianna Payment, am acting as Energy manager for [employer], PA-C I, Alois Cliche, PA-C have reviewed above note and agree with its content

## 2018-05-16 ENCOUNTER — Other Ambulatory Visit: Payer: Self-pay

## 2018-05-16 ENCOUNTER — Ambulatory Visit (INDEPENDENT_AMBULATORY_CARE_PROVIDER_SITE_OTHER): Payer: 59 | Admitting: Physician Assistant

## 2018-05-16 ENCOUNTER — Encounter (INDEPENDENT_AMBULATORY_CARE_PROVIDER_SITE_OTHER): Payer: Self-pay | Admitting: Physician Assistant

## 2018-05-16 DIAGNOSIS — E8881 Metabolic syndrome: Secondary | ICD-10-CM | POA: Diagnosis not present

## 2018-05-16 DIAGNOSIS — E669 Obesity, unspecified: Secondary | ICD-10-CM

## 2018-05-16 DIAGNOSIS — Z6830 Body mass index (BMI) 30.0-30.9, adult: Secondary | ICD-10-CM | POA: Diagnosis not present

## 2018-05-17 NOTE — Progress Notes (Signed)
Office: [phone]  /  Fax: [phone] TeleHealth Visit:  Darrel ReachJessica Grippi has verbally consented to this TeleHealth visit today. The patient is located at home, the provider is located at the [facility] office. The participants in this visit include the listed provider and patient. The visit was conducted today via Webex.  HPI:   Chief Complaint: OBESITY Bethany Young is here to discuss her progress with her obesity treatment plan. She is   keeping a food journal with 1200 calories and 85 grams of protein daily and is following her eating plan approximately 70% of the time. She states she is exercising 0 minutes 0 times per week. Bethany Young reports that she has been following the plan more often. She states she is bored with meat and supplementing with whey protein shakes. She is also now engaging in cardio HIIT training. We were unable to weigh the patient today for this TeleHealth visit. She feels as if she has lost 4 lbs since her last visit. She has lost 6 lbs since starting treatment with us.  Insulin Resistance Bethany Young has a diagnosis of insulin resistance based on her elevated fasting insulin level >5. Although Toyna's blood glucose readings are still under good control, insulin resistance puts her at greater risk of metabolic syndrome and diabetes. She is taking metformin currently and continues to work on diet and exercise to decrease risk of diabetes. No nausea, vomiting, diarrhea, or polyphagia.  ASSESSMENT AND PLAN:  Insulin resistance  Class 1 obesity with serious comorbidity and body mass index (BMI) of 30.0 to 30.9 in adult, unspecified obesity type  PLAN:  Insulin Resistance Bethany Young will continue to work on weight loss, exercise, and decreasing simple carbohydrates in her diet to help decrease the risk of diabetes. We dicussed metformin including benefits and risks. She was informed that eating too many simple carbohydrates or too many calories at one sitting  increases the likelihood of GI side effects. Bethany Young will continue metformin and weight loss. She agrees to follow-up with our clinic in 2 weeks to monitor her progress.  Obesity Bethany Young is currently in the action stage of change. As such, her goal is to continue with weight loss efforts. She has agreed to keep a food journal with 1200 calories and 85 grams of protein daily. Bethany Young has been instructed to work up to a goal of 150 minutes of combined cardio and strengthening exercise per week for weight loss and overall health benefits. We discussed the following Behavioral Modification Strategies today: work on meal planning, easy cooking plans, and keeping healthy foods in the home.  Bethany Young has agreed to follow-up with our clinic in 2 weeks. She was informed of the importance of frequent follow-up visits to maximize her success with intensive lifestyle modifications for her multiple health conditions.  ALLERGIES: Allergies  Allergen Reactions  . Ofloxacin Other (See Comments)    Burns eyes and makes them very irritated and does not help    MEDICATIONS: Current Outpatient Medications on File Prior to Visit  Medication Sig Dispense Refill  . diphenhydrAMINE (BENADRYL ALLERGY) 25 mg capsule Take 1 capsule (25 mg total) by mouth every 6 (six) hours as needed. 30 capsule 0  . hydrocortisone cream 1 % Apply 1 application topically 2 (two) times daily. 30 g 0  . metFORMIN (GLUCOPHAGE) 500 MG tablet Take 1 tablet (500 mg total) by mouth daily with breakfast. 30 tablet 0  . Vitamin D, Ergocalciferol, (DRISDOL) 1.25 MG (50000 UT) CAPS capsule Take 1 capsule (50,000  Units total) by mouth every 7 (seven) days. 4 capsule 0   No current facility-administered medications on file prior to visit.     PAST MEDICAL HISTORY: Past Medical History:  Diagnosis Date  . Anemia   . Anxiety   . Back pain   . Chest pain   . Dyspnea   . Fatigue   . Insomnia   . Joint pain   . Lower extremity edema      PAST SURGICAL HISTORY: Past Surgical History:  Procedure Laterality Date  . EYELID REPAIR W/ SKIN GRAFT      SOCIAL HISTORY: Social History   Tobacco Use  . Smoking status: Current Every Day Smoker    Types: E-cigarettes  . Smokeless tobacco: Never Used  Substance Use Topics  . Alcohol use: Yes    Comment: weekend drinker  . Drug use: No    Types: Marijuana    FAMILY HISTORY: Family History  Problem Relation Age of Onset  . Cancer Mother   . Thyroid disease Mother   . Depression Mother   . Anxiety disorder Mother   . Bipolar disorder Mother   . Drug abuse Mother   . Hypertension Father   . Hyperlipidemia Father   . Alcoholism Father   . Drug abuse Father   . Obesity Father    ROS: Review of Systems  Gastrointestinal: Negative for diarrhea, nausea and vomiting.  Endo/Heme/Allergies:       Negative for polyphagia.   PHYSICAL EXAM: Pt in no acute distress  RECENT LABS AND TESTS: BMET    Component Value Date/Time   NA 141 02/27/2018 0921   K 4.1 02/27/2018 0921   CL 104 02/27/2018 0921   CO2 21 02/27/2018 0921   GLUCOSE 80 02/27/2018 0921   BUN 8 02/27/2018 0921   CREATININE 0.75 02/27/2018 0921   CALCIUM 9.6 02/27/2018 0921   GFRNONAA 111 02/27/2018 0921   GFRAA 128 02/27/2018 0921   Lab Results  Component Value Date   HGBA1C 5.3 02/27/2018   Lab Results  Component Value Date   INSULIN 25.1 (H) 02/27/2018   CBC    Component Value Date/Time   WBC 9.1 02/27/2018 0921   RBC 4.27 02/27/2018 0921   HGB 11.6 02/27/2018 0921   HCT 35.3 02/27/2018 0921   PLT 345 01/08/2018 1801   MCV 83 02/27/2018 0921   MCH 27.2 02/27/2018 0921   MCHC 32.9 02/27/2018 0921   RDW 13.5 02/27/2018 0921   LYMPHSABS 2.6 02/27/2018 0921   EOSABS 0.4 02/27/2018 0921   BASOSABS 0.1 02/27/2018 0921   Iron/TIBC/Ferritin/ %Sat No results found for: IRON, TIBC, FERRITIN, IRONPCTSAT Lipid Panel     Component Value Date/Time   CHOL 142 02/27/2018 0921   TRIG 46  02/27/2018 0921   HDL 64 02/27/2018 0921   CHOLHDL 2.4 09/15/2017 1007   LDLCALC 69 02/27/2018 0921   Hepatic Function Panel     Component Value Date/Time   PROT 7.1 02/27/2018 0921   ALBUMIN 4.3 02/27/2018 0921   AST 14 02/27/2018 0921   ALT 9 02/27/2018 0921   ALKPHOS 85 02/27/2018 0921   BILITOT 0.2 02/27/2018 0921      Component Value Date/Time   TSH 1.390 02/27/2018 0921   TSH 1.030 04/24/2017 1510   Results for ANGELICAMARIE, CARNATHAN (MRN [id number]) as of 05/17/2018 09:11  Ref. Range 02/27/2018 09:21  Vitamin D, 25-Hydroxy Latest Ref Range: 30.0 - 100.0 ng/mL 13.5 (L)   I, Marianna Payment, am acting as Energy manager  for Abby Potash, PA-C I, Abby Potash, PA-C have reviewed above note and agree with its content

## 2018-05-30 ENCOUNTER — Encounter (INDEPENDENT_AMBULATORY_CARE_PROVIDER_SITE_OTHER): Payer: Self-pay | Admitting: Family Medicine

## 2018-05-30 ENCOUNTER — Other Ambulatory Visit: Payer: Self-pay

## 2018-05-30 ENCOUNTER — Ambulatory Visit (INDEPENDENT_AMBULATORY_CARE_PROVIDER_SITE_OTHER): Payer: 59 | Admitting: Family Medicine

## 2018-05-30 DIAGNOSIS — Z6831 Body mass index (BMI) 31.0-31.9, adult: Secondary | ICD-10-CM | POA: Insufficient documentation

## 2018-05-30 DIAGNOSIS — F418 Other specified anxiety disorders: Secondary | ICD-10-CM | POA: Insufficient documentation

## 2018-05-30 DIAGNOSIS — E88819 Insulin resistance, unspecified: Secondary | ICD-10-CM | POA: Insufficient documentation

## 2018-05-30 DIAGNOSIS — E8881 Metabolic syndrome: Secondary | ICD-10-CM

## 2018-05-30 DIAGNOSIS — E669 Obesity, unspecified: Secondary | ICD-10-CM | POA: Diagnosis not present

## 2018-05-30 DIAGNOSIS — Z6830 Body mass index (BMI) 30.0-30.9, adult: Secondary | ICD-10-CM

## 2018-05-30 MED ORDER — SERTRALINE HCL 50 MG PO TABS: 50.0000 mg | ORAL_TABLET | Freq: Every day | ORAL | 0 refills | Status: DC

## 2018-05-30 NOTE — Progress Notes (Signed)
Office: [phone]  /  Fax: [phone] TeleHealth Visit:  Bethany Young has verbally consented to this TeleHealth visit today. The patient is located at home, the provider is located at the [facility] office. The participants in this visit include the listed provider and patient. The visit was conducted today via Webex.  HPI:   Chief Complaint: OBESITY Bethany Young is here to discuss her progress with her obesity treatment plan. She is on the Category 2 plan or journaling 1200 calories + 85 grams of protein daily and is following her eating plan approximately 80% of the time. She states she is exercising 0 minutes 0 times per week. Bethany Young is journaling and has questions about tracking fat and carbs. She is sticking to the plan fairly well despite having increasing issues with anxiety and depression.  We were unable to weigh the patient today for this TeleHealth visit. She states her weight is 192 lbs. She has lost 6 lbs since starting treatment with [name].  Anxiety/Depression with emotional eating behaviors Bethany Young is struggling with emotional eating and using food for comfort to the extent that it is negatively impacting her health. She often snacks when she is not hungry. Bethany Young sometimes feels she is out of control and then feels guilty that she made poor food choices. She has been working on behavior modification techniques to help reduce her emotional eating and has been somewhat successful. Bethany Young reports increased stress and depression and reports missing work this week due to anxiety and depression. She is not sleeping well and having "lucid" dreams so she is avoiding sleep. She is unsure why she is anxious and depressed.   She shows no sign of suicidal or homicidal ideations.  Depression screen [facility] 2/9 02/27/2018 12/08/2017 10/18/2017 09/15/2017 04/24/2017  Decreased Interest 3 2 1 1 1   Down, Depressed, Hopeless 2 1 1 1 2   PHQ - 2 Score 5 3 2 2 3   Altered sleeping 2 2 1 3 3    Tired, decreased energy 2 2 1 3 2   Change in appetite 1 2 2 2 3   Feeling bad or failure about yourself  1 0 0 0 1  Trouble concentrating 2 0 0 1 0  Moving slowly or fidgety/restless 0 0 0 1 0  Suicidal thoughts 0 0 0 1 1  PHQ-9 Score 13 9 6 13 13   Difficult doing work/chores Somewhat difficult - - - -   Insulin Resistance Bethany Young has a diagnosis of insulin resistance based on her elevated fasting insulin level >5. Although Bethany Young's blood glucose readings are still under good control, insulin resistance puts her at greater risk of metabolic syndrome and diabetes. She is taking metformin currently but does not always remember to take it. She reports having loose stool with metformin at times. She continues to work on diet and exercise to decrease risk of diabetes. No polyphagia.  ASSESSMENT AND PLAN:  Depression with anxiety - Plan: sertraline (ZOLOFT) 50 MG tablet  Insulin resistance  Class 1 obesity with serious comorbidity and body mass index (BMI) of 30.0 to 30.9 in adult, unspecified obesity type  PLAN:  Anxiety/Depression with Emotional Eating Behaviors We discussed behavior modification techniques today to help Dorthie deal with her emotional eating and depression. She plans on making an appointment with a counselor and I encouraged her to do so. She was prescribed Trazodone by her PCP, which she is afraid to take. She was given a new prescription for Zoloft 50 mg QD #30 with 0 refills  and agrees to follow-up with our clinic in 2 weeks.  Insulin Resistance Bethany Young will continue to work on weight loss, exercise, and decreasing simple carbohydrates in her diet to help decrease the risk of diabetes.  Bethany Young will develop a strategy to improve compliance with metformin and will follow-up with [name] as directed to monitor her progress.  Obesity Bethany Young is currently in the action stage of change. As such, her goal is to continue with weight loss efforts. She has agreed to keep a food  journal with 1200 calories and 85 grams of protein.  We discussed the following Behavioral Modification Strategies today: dealing with family or coworker sabotage, planning for success, and keep a strict food journal.  Bethany Young has agreed to follow-up with our clinic in 2 weeks. She was informed of the importance of frequent follow-up visits to maximize her success with intensive lifestyle modifications for her multiple health conditions.  ALLERGIES: Allergies  Allergen Reactions  . Ofloxacin Other (See Comments)    Burns eyes and makes them very irritated and does not help    MEDICATIONS: Current Outpatient Medications on File Prior to Visit  Medication Sig Dispense Refill  . diphenhydrAMINE (BENADRYL ALLERGY) 25 mg capsule Take 1 capsule (25 mg total) by mouth every 6 (six) hours as needed. 30 capsule 0  . hydrocortisone cream 1 % Apply 1 application topically 2 (two) times daily. 30 g 0  . metFORMIN (GLUCOPHAGE) 500 MG tablet Take 1 tablet (500 mg total) by mouth daily with breakfast. 30 tablet 0  . Vitamin D, Ergocalciferol, (DRISDOL) 1.25 MG (50000 UT) CAPS capsule Take 1 capsule (50,000 Units total) by mouth every 7 (seven) days. 4 capsule 0   No current facility-administered medications on file prior to visit.     PAST MEDICAL HISTORY: Past Medical History:  Diagnosis Date  . Anemia   . Anxiety   . Back pain   . Chest pain   . Dyspnea   . Fatigue   . Insomnia   . Joint pain   . Lower extremity edema     PAST SURGICAL HISTORY: Past Surgical History:  Procedure Laterality Date  . EYELID REPAIR W/ SKIN GRAFT      SOCIAL HISTORY: Social History   Tobacco Use  . Smoking status: Current Every Day Smoker    Types: E-cigarettes  . Smokeless tobacco: Never Used  Substance Use Topics  . Alcohol use: Yes    Comment: weekend drinker  . Drug use: No    Types: Marijuana    FAMILY HISTORY: Family History  Problem Relation Age of Onset  . Cancer Mother   . Thyroid  disease Mother   . Depression Mother   . Anxiety disorder Mother   . Bipolar disorder Mother   . Drug abuse Mother   . Hypertension Father   . Hyperlipidemia Father   . Alcoholism Father   . Drug abuse Father   . Obesity Father    ROS: Review of Systems  Endo/Heme/Allergies:       Negative for polyphagia.  Psychiatric/Behavioral: Positive for depression (emotional eating). Negative for suicidal ideas. The patient is nervous/anxious.        Negative for homicidal ideas.   PHYSICAL EXAM: Pt in no acute distress  RECENT LABS AND TESTS: BMET    Component Value Date/Time   NA 141 02/27/2018 0921   K 4.1 02/27/2018 0921   CL 104 02/27/2018 0921   CO2 21 02/27/2018 0921   GLUCOSE 80 02/27/2018 0921  BUN 8 02/27/2018 0921   CREATININE 0.75 02/27/2018 0921   CALCIUM 9.6 02/27/2018 0921   GFRNONAA 111 02/27/2018 0921   GFRAA 128 02/27/2018 0921   Lab Results  Component Value Date   HGBA1C 5.3 02/27/2018   Lab Results  Component Value Date   INSULIN 25.1 (H) 02/27/2018   CBC    Component Value Date/Time   WBC 9.1 02/27/2018 0921   RBC 4.27 02/27/2018 0921   HGB 11.6 02/27/2018 0921   HCT 35.3 02/27/2018 0921   PLT 345 01/08/2018 1801   MCV 83 02/27/2018 0921   MCH 27.2 02/27/2018 0921   MCHC 32.9 02/27/2018 0921   RDW 13.5 02/27/2018 0921   LYMPHSABS 2.6 02/27/2018 0921   EOSABS 0.4 02/27/2018 0921   BASOSABS 0.1 02/27/2018 0921   Iron/TIBC/Ferritin/ %Sat No results found for: IRON, TIBC, FERRITIN, IRONPCTSAT Lipid Panel     Component Value Date/Time   CHOL 142 02/27/2018 0921   TRIG 46 02/27/2018 0921   HDL 64 02/27/2018 0921   CHOLHDL 2.4 09/15/2017 1007   LDLCALC 69 02/27/2018 0921   Hepatic Function Panel     Component Value Date/Time   PROT 7.1 02/27/2018 0921   ALBUMIN 4.3 02/27/2018 0921   AST 14 02/27/2018 0921   ALT 9 02/27/2018 0921   ALKPHOS 85 02/27/2018 0921   BILITOT 0.2 02/27/2018 0921      Component Value Date/Time   TSH 1.390  02/27/2018 0921   TSH 1.030 04/24/2017 1510   Results for Darrel ReachOWELL, Vincenzina (MRN [id number]) as of 05/30/2018 14:54  Ref. Range 02/27/2018 09:21  Vitamin D, 25-Hydroxy Latest Ref Range: 30.0 - 100.0 ng/mL 13.5 (L)    I, Marianna Paymentenise Haag, am acting as Energy managertranscriptionist for AshlandDawn Yotam Rhine, American Family InsuranceFNP-C.  I have reviewed the above documentation for accuracy and completeness, and I agree with the above.  - Faline Langer, FNP-C.

## 2018-05-31 ENCOUNTER — Other Ambulatory Visit (INDEPENDENT_AMBULATORY_CARE_PROVIDER_SITE_OTHER): Payer: Self-pay | Admitting: Physician Assistant

## 2018-05-31 DIAGNOSIS — E8881 Metabolic syndrome: Secondary | ICD-10-CM

## 2018-06-04 ENCOUNTER — Encounter (INDEPENDENT_AMBULATORY_CARE_PROVIDER_SITE_OTHER): Payer: Self-pay | Admitting: Family Medicine

## 2018-06-04 NOTE — Telephone Encounter (Signed)
Please advise 

## 2018-06-07 ENCOUNTER — Encounter: Payer: Self-pay | Admitting: Nurse Practitioner

## 2018-06-12 ENCOUNTER — Other Ambulatory Visit (INDEPENDENT_AMBULATORY_CARE_PROVIDER_SITE_OTHER): Payer: Self-pay | Admitting: Physician Assistant

## 2018-06-12 DIAGNOSIS — E559 Vitamin D deficiency, unspecified: Secondary | ICD-10-CM

## 2018-06-14 ENCOUNTER — Other Ambulatory Visit: Payer: Self-pay

## 2018-06-14 ENCOUNTER — Ambulatory Visit (INDEPENDENT_AMBULATORY_CARE_PROVIDER_SITE_OTHER): Payer: 59 | Admitting: Physician Assistant

## 2018-06-14 ENCOUNTER — Encounter (INDEPENDENT_AMBULATORY_CARE_PROVIDER_SITE_OTHER): Payer: Self-pay | Admitting: Physician Assistant

## 2018-06-14 DIAGNOSIS — E8881 Metabolic syndrome: Secondary | ICD-10-CM | POA: Diagnosis not present

## 2018-06-14 DIAGNOSIS — Z6830 Body mass index (BMI) 30.0-30.9, adult: Secondary | ICD-10-CM

## 2018-06-14 DIAGNOSIS — E669 Obesity, unspecified: Secondary | ICD-10-CM

## 2018-06-14 DIAGNOSIS — E559 Vitamin D deficiency, unspecified: Secondary | ICD-10-CM

## 2018-06-14 MED ORDER — METFORMIN HCL 500 MG PO TABS: 500.0000 mg | ORAL_TABLET | Freq: Every day | ORAL | 0 refills | Status: DC

## 2018-06-14 MED ORDER — VITAMIN D (ERGOCALCIFEROL) 1.25 MG (50000 UNIT) PO CAPS: 50000.0000 [IU] | ORAL_CAPSULE | ORAL | 0 refills | Status: DC

## 2018-06-14 NOTE — Progress Notes (Signed)
Office: [phone]  /  Fax: [phone] TeleHealth Visit:  Bethany Young has verbally consented to this TeleHealth visit today. The patient is located at home, the provider is located at the [facility] office. The participants in this visit include the listed provider and patient. The visit was conducted today via Webex.  HPI:   Chief Complaint: OBESITY Bethany Young is here to discuss her progress with her obesity treatment plan. She is keeping a food journal with 1200 calories and 85 grams of protein and is following her eating plan approximately 50 % of the time. She states she is exercising 0 minutes 0 times per week. Bethany Young reports that she has been battling depression and went through a period when she was only eating once daily. She has been on Zoloft for 1 week and states that she is trying to make herself eat more.  We were unable to weigh the patient today for this TeleHealth visit. She feels as if she has gained weight since her last visit. She has lost 6 lbs since starting treatment with [name].  Vitamin D Deficiency Bethany Young has a diagnosis of vitamin D deficiency. She is currently on vit D. Bethany Young denies nausea, vomiting, or muscle weakness.  Insulin Resistance Bethany Young has a diagnosis of insulin resistance based on her elevated fasting insulin level >5. Although Bethany Young's blood glucose readings are still under good control, insulin resistance puts her at greater risk of metabolic syndrome and diabetes. She is taking metformin currently and continues to work on diet and exercise to decrease risk of diabetes. [name] denies polyphagia, nausea, vomiting, or diarrhea.   ASSESSMENT AND PLAN:  Vitamin D deficiency - Plan: Vitamin D, Ergocalciferol, (DRISDOL) 1.25 MG (50000 UT) CAPS capsule  Insulin resistance - Plan: metFORMIN (GLUCOPHAGE) 500 MG tablet  Class 1 obesity with serious comorbidity and body mass index (BMI) of 30.0 to 30.9 in adult, unspecified obesity type   PLAN:  Vitamin D Deficiency Bethany Young was informed that low vitamin D levels contribute to fatigue and are associated with obesity, breast, and colon cancer. Bethany Young agrees to continue to take prescription Vit D ,000 IU every week #4 with no refills and will follow up for routine testing of vitamin D, at least 2-3 times per year. She was informed of the risk of over-replacement of vitamin D and agrees to not increase her dose unless she discusses this with [name] first. Bethany Young agrees to follow up in 2 weeks as directed.  Insulin Resistance Bethany Young will continue to work on weight loss, exercise, and decreasing simple carbohydrates in her diet to help decrease the risk of diabetes. She was informed that eating too many simple carbohydrates or too many calories at one sitting increases the likelihood of GI side effects. Bethany Young agreed to continue metformin 500 mg qAM #30 with no refills and prescription was written today. Bethany Young agreed to follow up with [name] as directed to monitor her progress.   Obesity Bethany Young is currently in the action stage of change. As such, her goal is to continue with weight loss efforts. She has agreed to keep a food journal with 1200 calories and 85 grams of protein daily. Bethany Young has been instructed to work up to a goal of 150 minutes of combined cardio and strengthening exercise per week for weight loss and overall health benefits. We discussed the following Behavioral Modification Strategies today: work on meal planning and easy cooking plans and no skipping meals.  Bethany Young has agreed to follow up with our clinic  in 2 weeks. She was informed of the importance of frequent follow up visits to maximize her success with intensive lifestyle modifications for her multiple health conditions.  ALLERGIES: Allergies  Allergen Reactions  . Ofloxacin Other (See Comments)    Burns eyes and makes them very irritated and does not help    MEDICATIONS: Current Outpatient  Medications on File Prior to Visit  Medication Sig Dispense Refill  . diphenhydrAMINE (BENADRYL ALLERGY) 25 mg capsule Take 1 capsule (25 mg total) by mouth every 6 (six) hours as needed. 30 capsule 0  . hydrocortisone cream 1 % Apply 1 application topically 2 (two) times daily. 30 g 0  . sertraline (ZOLOFT) 50 MG tablet Take 1 tablet (50 mg total) by mouth daily. 30 tablet 0   No current facility-administered medications on file prior to visit.     PAST MEDICAL HISTORY: Past Medical History:  Diagnosis Date  . Anemia   . Anxiety   . Back pain   . Chest pain   . Dyspnea   . Fatigue   . Insomnia   . Joint pain   . Lower extremity edema     PAST SURGICAL HISTORY: Past Surgical History:  Procedure Laterality Date  . EYELID REPAIR W/ SKIN GRAFT      SOCIAL HISTORY: Social History   Tobacco Use  . Smoking status: Current Every Day Smoker    Types: E-cigarettes  . Smokeless tobacco: Never Used  Substance Use Topics  . Alcohol use: Yes    Comment: weekend drinker  . Drug use: No    Types: Marijuana    FAMILY HISTORY: Family History  Problem Relation Age of Onset  . Cancer Mother   . Thyroid disease Mother   . Depression Mother   . Anxiety disorder Mother   . Bipolar disorder Mother   . Drug abuse Mother   . Hypertension Father   . Hyperlipidemia Father   . Alcoholism Father   . Drug abuse Father   . Obesity Father     ROS: Review of Systems  Gastrointestinal: Negative for diarrhea, nausea and vomiting.  Musculoskeletal:       Negative for muscle weakness.  Endo/Heme/Allergies:       Negative for polyphagia.    PHYSICAL EXAM: Pt in no acute distress  RECENT LABS AND TESTS: BMET    Component Value Date/Time   NA 141 02/27/2018 0921   K 4.1 02/27/2018 0921   CL 104 02/27/2018 0921   CO2 21 02/27/2018 0921   GLUCOSE 80 02/27/2018 0921   BUN 8 02/27/2018 0921   CREATININE 0.75 02/27/2018 0921   CALCIUM 9.6 02/27/2018 0921   GFRNONAA 111  02/27/2018 0921   GFRAA 128 02/27/2018 0921   Lab Results  Component Value Date   HGBA1C 5.3 02/27/2018   Lab Results  Component Value Date   INSULIN 25.1 (H) 02/27/2018   CBC    Component Value Date/Time   WBC 9.1 02/27/2018 0921   RBC 4.27 02/27/2018 0921   HGB 11.6 02/27/2018 0921   HCT 35.3 02/27/2018 0921   PLT 345 01/08/2018 1801   MCV 83 02/27/2018 0921   MCH 27.2 02/27/2018 0921   MCHC 32.9 02/27/2018 0921   RDW 13.5 02/27/2018 0921   LYMPHSABS 2.6 02/27/2018 0921   EOSABS 0.4 02/27/2018 0921   BASOSABS 0.1 02/27/2018 0921   Iron/TIBC/Ferritin/ %Sat No results found for: IRON, TIBC, FERRITIN, IRONPCTSAT Lipid Panel     Component Value Date/Time   CHOL  142 02/27/2018 0921   TRIG 46 02/27/2018 0921   HDL 64 02/27/2018 0921   CHOLHDL 2.4 09/15/2017 1007   LDLCALC 69 02/27/2018 0921   Hepatic Function Panel     Component Value Date/Time   PROT 7.1 02/27/2018 0921   ALBUMIN 4.3 02/27/2018 0921   AST 14 02/27/2018 0921   ALT 9 02/27/2018 0921   ALKPHOS 85 02/27/2018 0921   BILITOT 0.2 02/27/2018 0921      Component Value Date/Time   TSH 1.390 02/27/2018 0921   TSH 1.030 04/24/2017 1510    Results for MARGURETE, HOEPNER (MRN [id number]) as of 06/14/2018 16:13  Ref. Range 02/27/2018 09:21  Vitamin D, 25-Hydroxy Latest Ref Range: 30.0 - 100.0 ng/mL 13.5 (L)    I, Kirke Corin, CMA, am acting as transcriptionist for Alois Cliche, PA-C I, Alois Cliche, PA-C have reviewed above note and agree with its content

## 2018-06-26 ENCOUNTER — Ambulatory Visit (INDEPENDENT_AMBULATORY_CARE_PROVIDER_SITE_OTHER): Payer: 59 | Admitting: Family Medicine

## 2018-06-26 ENCOUNTER — Encounter (INDEPENDENT_AMBULATORY_CARE_PROVIDER_SITE_OTHER): Payer: Self-pay | Admitting: Family Medicine

## 2018-06-26 ENCOUNTER — Other Ambulatory Visit: Payer: Self-pay

## 2018-06-26 DIAGNOSIS — E88819 Insulin resistance, unspecified: Secondary | ICD-10-CM

## 2018-06-26 DIAGNOSIS — E669 Obesity, unspecified: Secondary | ICD-10-CM

## 2018-06-26 DIAGNOSIS — Z6830 Body mass index (BMI) 30.0-30.9, adult: Secondary | ICD-10-CM | POA: Diagnosis not present

## 2018-06-26 DIAGNOSIS — E8881 Metabolic syndrome: Secondary | ICD-10-CM | POA: Diagnosis not present

## 2018-06-26 DIAGNOSIS — E66811 Obesity, class 1: Secondary | ICD-10-CM

## 2018-06-26 DIAGNOSIS — F418 Other specified anxiety disorders: Secondary | ICD-10-CM

## 2018-06-26 MED ORDER — SERTRALINE HCL 50 MG PO TABS: 50.0000 mg | ORAL_TABLET | Freq: Every day | ORAL | 0 refills | Status: DC

## 2018-06-27 NOTE — Progress Notes (Signed)
Office: [phone]  /  Fax: [phone] TeleHealth Visit:  Bethany Young has verbally consented to this TeleHealth visit today. The patient is located at home, the provider is located at the [facility] office. The participants in this visit include the listed provider and patient. The visit was conducted today via Webex.  HPI:   Chief Complaint: OBESITY Bethany Young is here to discuss her progress with her obesity treatment plan. She is keeping a food journal with 1200 calories and 85+ grams of protein and is following her eating plan approximately 50 % of the time. She states she is exercising 0 minutes 0 times per week. Stana has a friend that is staying with her and that is getting her off track. Her friend eats unhealthy foods. Her weight is maintained and she is journaling sporadically. Cayli sometimes uses a protein shake for a meal replacement.  We were unable to weigh the patient today for this TeleHealth visit. She feels as if she has maintained weight since her last visit. She has lost 6 lbs since starting treatment with [name].  Insulin Resistance Bethany Young has a diagnosis of insulin resistance based on her elevated fasting insulin level >5. Although Bethany Young's blood glucose readings are still under good control, insulin resistance puts her at greater risk of metabolic syndrome and diabetes. She is taking metformin currently and reports that the price of metformin has increased. Bethany Young sometimes forgets to take it, but feels that it helps with polyphagia. She continues to work on diet and exercise to decrease risk of diabetes.  Depression with Anxiety Bethany Young's mood is improved and stable. She feels Zoloft causes her to be fatigued but this may be due to impending period. She has been working on behavior modification techniques to help reduce her emotional eating and has been somewhat successful.   Depression screen [facility] 2/9 02/27/2018 12/08/2017 10/18/2017 09/15/2017  04/24/2017  Decreased Interest 3 2 1 1 1   Down, Depressed, Hopeless 2 1 1 1 2   PHQ - 2 Score 5 3 2 2 3   Altered sleeping 2 2 1 3 3   Tired, decreased energy 2 2 1 3 2   Change in appetite 1 2 2 2 3   Feeling bad or failure about yourself  1 0 0 0 1  Trouble concentrating 2 0 0 1 0  Moving slowly or fidgety/restless 0 0 0 1 0  Suicidal thoughts 0 0 0 1 1  PHQ-9 Score 13 9 6 13 13   Difficult doing work/chores Somewhat difficult - - - -   ASSESSMENT AND PLAN:  Depression with anxiety - Plan: sertraline (ZOLOFT) 50 MG tablet  Insulin resistance  Class 1 obesity with serious comorbidity and body mass index (BMI) of 30.0 to 30.9 in adult, unspecified obesity type  PLAN:  Insulin Resistance Icel will continue to work on weight loss, exercise, and decreasing simple carbohydrates in her diet to help decrease the risk of diabetes. Pa agreed to continue with her meal plan. I encouraged her to take metformin as directed. Aliyyah agreed to follow up with [name] as directed to monitor her progress in 2 weeks.  Depression with Emotional Eating Behaviors We discussed behavior modification techniques today to help Shalisa deal with her emotional eating and depression. She has agreed to continue to take Zoloft 50 mg qd #30 with no refills and agreed to follow up as directed.  Obesity Bethany Young is currently in the action stage of change. As such, her goal is to continue with weight loss efforts.  She has agreed to keep a food journal with 1200 calories and 85+ grams of protein daily.  Bethany Young has been instructed to work up to a goal of 150 minutes of combined cardio and strengthening exercise per week for weight loss and overall health benefits. We discussed the following Behavioral Modification Strategies today: work on meal planning and easy cooking plans, keeping healthy, keeping healthy foods in the home, planning for success, keeping a strict food journal, and dealing with family or coworker  sabotage. Bethany Young was encouraged to eat her food rather than drinking protein shakes.  Bethany Young has agreed to follow up with our clinic in 2 weeks. She was informed of the importance of frequent follow up visits to maximize her success with intensive lifestyle modifications for her multiple health conditions.  ALLERGIES: Allergies  Allergen Reactions  . Ofloxacin Other (See Comments)    Burns eyes and makes them very irritated and does not help    MEDICATIONS: Current Outpatient Medications on File Prior to Visit  Medication Sig Dispense Refill  . diphenhydrAMINE (BENADRYL ALLERGY) 25 mg capsule Take 1 capsule (25 mg total) by mouth every 6 (six) hours as needed. 30 capsule 0  . hydrocortisone cream 1 % Apply 1 application topically 2 (two) times daily. 30 g 0  . metFORMIN (GLUCOPHAGE) 500 MG tablet Take 1 tablet (500 mg total) by mouth daily with breakfast. 30 tablet 0  . Vitamin D, Ergocalciferol, (DRISDOL) 1.25 MG (50000 UT) CAPS capsule Take 1 capsule (50,000 Units total) by mouth every 7 (seven) days. 4 capsule 0   No current facility-administered medications on file prior to visit.     PAST MEDICAL HISTORY: Past Medical History:  Diagnosis Date  . Anemia   . Anxiety   . Back pain   . Chest pain   . Dyspnea   . Fatigue   . Insomnia   . Joint pain   . Lower extremity edema     PAST SURGICAL HISTORY: Past Surgical History:  Procedure Laterality Date  . EYELID REPAIR W/ SKIN GRAFT      SOCIAL HISTORY: Social History   Tobacco Use  . Smoking status: Current Every Day Smoker    Types: E-cigarettes  . Smokeless tobacco: Never Used  Substance Use Topics  . Alcohol use: Yes    Comment: weekend drinker  . Drug use: No    Types: Marijuana    FAMILY HISTORY: Family History  Problem Relation Age of Onset  . Cancer Mother   . Thyroid disease Mother   . Depression Mother   . Anxiety disorder Mother   . Bipolar disorder Mother   . Drug abuse Mother   .  Hypertension Father   . Hyperlipidemia Father   . Alcoholism Father   . Drug abuse Father   . Obesity Father     ROS: Review of Systems  Endo/Heme/Allergies:       Positive for Polyphagia  Psychiatric/Behavioral: Positive for depression. The patient is nervous/anxious.     PHYSICAL EXAM: Pt in no acute distress  RECENT LABS AND TESTS: BMET    Component Value Date/Time   NA 141 02/27/2018 0921   K 4.1 02/27/2018 0921   CL 104 02/27/2018 0921   CO2 21 02/27/2018 0921   GLUCOSE 80 02/27/2018 0921   BUN 8 02/27/2018 0921   CREATININE 0.75 02/27/2018 0921   CALCIUM 9.6 02/27/2018 0921   GFRNONAA 111 02/27/2018 0921   GFRAA 128 02/27/2018 0921   Lab Results  Component  Value Date   HGBA1C 5.3 02/27/2018   Lab Results  Component Value Date   INSULIN 25.1 (H) 02/27/2018   CBC    Component Value Date/Time   WBC 9.1 02/27/2018 0921   RBC 4.27 02/27/2018 0921   HGB 11.6 02/27/2018 0921   HCT 35.3 02/27/2018 0921   PLT 345 01/08/2018 1801   MCV 83 02/27/2018 0921   MCH 27.2 02/27/2018 0921   MCHC 32.9 02/27/2018 0921   RDW 13.5 02/27/2018 0921   LYMPHSABS 2.6 02/27/2018 0921   EOSABS 0.4 02/27/2018 0921   BASOSABS 0.1 02/27/2018 0921   Iron/TIBC/Ferritin/ %Sat No results found for: IRON, TIBC, FERRITIN, IRONPCTSAT Lipid Panel     Component Value Date/Time   CHOL 142 02/27/2018 0921   TRIG 46 02/27/2018 0921   HDL 64 02/27/2018 0921   CHOLHDL 2.4 09/15/2017 1007   LDLCALC 69 02/27/2018 0921   Hepatic Function Panel     Component Value Date/Time   PROT 7.1 02/27/2018 0921   ALBUMIN 4.3 02/27/2018 0921   AST 14 02/27/2018 0921   ALT 9 02/27/2018 0921   ALKPHOS 85 02/27/2018 0921   BILITOT 0.2 02/27/2018 0921      Component Value Date/Time   TSH 1.390 02/27/2018 0921   TSH 1.030 04/24/2017 1510    Results for Darrel ReachOWELL, Danylle (MRN [id number]) as of 06/27/2018 13:27  Ref. Range 02/27/2018 09:21  Vitamin D, 25-Hydroxy Latest Ref Range: 30.0 - 100.0 ng/mL  13.5 (L)    I, Kirke Corinara Soares, CMA, am acting as Energy managertranscriptionist for [place] Tool WorksDawn W. Alyssa Rotondo, FNP-C.  I have reviewed the above documentation for accuracy and completeness, and I agree with the above.  - Rafi Kenneth, FNP-C.

## 2018-06-28 ENCOUNTER — Encounter (INDEPENDENT_AMBULATORY_CARE_PROVIDER_SITE_OTHER): Payer: Self-pay | Admitting: Family Medicine

## 2018-07-09 ENCOUNTER — Encounter: Payer: Self-pay | Admitting: Nurse Practitioner

## 2018-07-10 ENCOUNTER — Telehealth (INDEPENDENT_AMBULATORY_CARE_PROVIDER_SITE_OTHER): Payer: 59 | Admitting: Family Medicine

## 2018-07-10 ENCOUNTER — Other Ambulatory Visit: Payer: Self-pay

## 2018-07-10 ENCOUNTER — Encounter (INDEPENDENT_AMBULATORY_CARE_PROVIDER_SITE_OTHER): Payer: Self-pay | Admitting: Family Medicine

## 2018-07-10 DIAGNOSIS — F418 Other specified anxiety disorders: Secondary | ICD-10-CM | POA: Diagnosis not present

## 2018-07-10 DIAGNOSIS — E66811 Obesity, class 1: Secondary | ICD-10-CM

## 2018-07-10 DIAGNOSIS — E88819 Insulin resistance, unspecified: Secondary | ICD-10-CM

## 2018-07-10 DIAGNOSIS — E669 Obesity, unspecified: Secondary | ICD-10-CM

## 2018-07-10 DIAGNOSIS — E8881 Metabolic syndrome: Secondary | ICD-10-CM | POA: Diagnosis not present

## 2018-07-10 DIAGNOSIS — Z6830 Body mass index (BMI) 30.0-30.9, adult: Secondary | ICD-10-CM

## 2018-07-10 MED ORDER — VENLAFAXINE HCL ER 37.5 MG PO CP24: 37.5000 mg | ORAL_CAPSULE | Freq: Every day | ORAL | 0 refills | Status: DC

## 2018-07-10 MED ORDER — METFORMIN HCL 500 MG PO TABS: 500.0000 mg | ORAL_TABLET | Freq: Every day | ORAL | 0 refills | Status: DC

## 2018-07-10 NOTE — Progress Notes (Signed)
Office: [phone]  /  Fax: [phone] TeleHealth Visit:  Bethany Young has verbally consented to this TeleHealth visit today. The patient is located at home, the provider is located at the [employer] [facility] office. The participants in this visit include the listed provider and patient. The visit was conducted today via webex.  HPI:   Chief Complaint: OBESITY Bethany Young is here to discuss her progress with her obesity treatment plan. She is on the keep a food journal with 1200 calories and 85+ grams of protein daily and is following her eating plan approximately 60 % of the time. She states she is doing yoga for 10-15 minutes 5 times per week. Bethany Young has been journaling inconsistently. She gets off track on weekends and eats out with her friends, and sometimes 2 meals per day.  We were unable to weigh the patient today for this TeleHealth visit. She feels as if she has maintained her weight since her last visit. She has lost 6 lbs since starting treatment with [name].  Insulin Resistance Bethany Young has a diagnosis of insulin resistance based on her elevated fasting insulin level >5. Although Bethany Young's blood glucose readings are still under good control, insulin resistance puts her at greater risk of metabolic syndrome and diabetes. She is taking metformin currently and notes polyphagia. She needs a 90 day supply of metformin for her insurance to cover at a lower rate. She continues to work on diet and exercise to decrease risk of diabetes. Lab Results  Component Value Date   HGBA1C 5.3 02/27/2018    Depression with Anxiety Chan started Zoloft on 05/30/2018. She stopped it because it caused headaches and fatigue. However, she has started having nightmares since stopping Zoloft. She describes episodes of being very impulsive along with episodes of depression, and has even asked for FMLA for depression at her primary care physician office. She notes her mother is bipolar. She shows no sign  of suicidal or homicidal ideations.  ASSESSMENT AND PLAN:  Other depression - Plan: Ambulatory referral to Psychiatry  Insulin resistance - Plan: metFORMIN (GLUCOPHAGE) 500 MG tablet  PLAN:  Insulin Resistance Bethany Young will continue to work on weight loss, exercise, and decreasing simple carbohydrates in her diet to help decrease the risk of diabetes.  Bethany Young agrees to continue taking metformin 500 mg q AM #90 day supply with no refills. Bethany Young agrees to follow up with our clinic in 2 to 3 weeks as directed to monitor her progress.  Depression with Anxiety We discussed behavior modification techniques today to help Bethany Young deal with her depression and anxiety. We have sent a referral to [place] for evaluation. Bethany Young agrees to start Effexor 37.5 mg q AM #30 with no refills. Bethany Young agrees to follow up with our clinic in 2 to 3 weeks.  Obesity Bethany Young is currently in the action stage of change. As such, her goal is to continue with weight loss efforts She has agreed to keep a food journal with 1200 calories and 85+ grams of protein daily Bethany Young is to stick to the plan Monday thru Friday, and have 1 indulgent meal each day on Saturday and Sunday. Bethany Young has been instructed to work up to a goal of 150 minutes of combined cardio and strengthening exercise per week for weight loss and overall health benefits. We discussed the following Behavioral Modification Strategies today: decreasing simple carbohydrates  and work on meal planning and easy cooking plans, planning for success, and keep a strict food journal  Bethany Young has agreed to follow up with our clinic in 2 to 3 weeks. She was informed of the importance of frequent follow up visits to maximize her success with intensive lifestyle modifications for her multiple health conditions.  ALLERGIES: Allergies  Allergen Reactions   Ofloxacin Other (See Comments)    Burns eyes and makes them very irritated and  does not help   Zoloft [Sertraline Hcl] Other (See Comments)    headache and fatigue    MEDICATIONS: Current Outpatient Medications on File Prior to Visit  Medication Sig Dispense Refill   diphenhydrAMINE (BENADRYL ALLERGY) 25 mg capsule Take 1 capsule (25 mg total) by mouth every 6 (six) hours as needed. 30 capsule 0   hydrocortisone cream 1 % Apply 1 application topically 2 (two) times daily. 30 g 0   Vitamin D, Ergocalciferol, (DRISDOL) 1.25 MG (50000 UT) CAPS capsule Take 1 capsule (50,000 Units total) by mouth every 7 (seven) days. 4 capsule 0   No current facility-administered medications on file prior to visit.     PAST MEDICAL HISTORY: Past Medical History:  Diagnosis Date   Anemia    Anxiety    Back pain    Chest pain    Dyspnea    Fatigue    Insomnia    Joint pain    Lower extremity edema     PAST SURGICAL HISTORY: Past Surgical History:  Procedure Laterality Date   EYELID REPAIR W/ SKIN GRAFT      SOCIAL HISTORY: Social History   Tobacco Use   Smoking status: Current Every Day Smoker    Types: E-cigarettes   Smokeless tobacco: Never Used  Substance Use Topics   Alcohol use: Yes    Comment: weekend drinker   Drug use: No    Types: Marijuana    FAMILY HISTORY: Family History  Problem Relation Age of Onset   Cancer Mother    Thyroid disease Mother    Depression Mother    Anxiety disorder Mother    Bipolar disorder Mother    Drug abuse Mother    Hypertension Father    Hyperlipidemia Father    Alcoholism Father    Drug abuse Father    Obesity Father     ROS: Review of Systems  Constitutional: Negative for weight loss.  Endo/Heme/Allergies:       Positive polyphagia  Psychiatric/Behavioral: Positive for depression. Negative for suicidal ideas.       + Anxiety    PHYSICAL EXAM: Pt in no acute distress  RECENT LABS AND TESTS: BMET    Component Value Date/Time   NA 141 02/27/2018 0921   K 4.1 02/27/2018  0921   CL 104 02/27/2018 0921   CO2 21 02/27/2018 0921   GLUCOSE 80 02/27/2018 0921   BUN 8 02/27/2018 0921   CREATININE 0.75 02/27/2018 0921   CALCIUM 9.6 02/27/2018 0921   GFRNONAA 111 02/27/2018 0921   GFRAA 128 02/27/2018 0921   Lab Results  Component Value Date   HGBA1C 5.3 02/27/2018   Lab Results  Component Value Date   INSULIN 25.1 (H) 02/27/2018   CBC    Component Value Date/Time   WBC 9.1 02/27/2018 0921   RBC 4.27 02/27/2018 0921   HGB 11.6 02/27/2018 0921   HCT 35.3 02/27/2018 0921   PLT 345 01/08/2018 1801   MCV 83 02/27/2018 0921   MCH 27.2 02/27/2018 0921   MCHC 32.9 02/27/2018 0921   RDW 13.5 02/27/2018 0921   LYMPHSABS 2.6 02/27/2018 [id number]  EOSABS 0.4 02/27/2018 0921   BASOSABS 0.1 02/27/2018 0921   Iron/TIBC/Ferritin/ %Sat No results found for: IRON, TIBC, FERRITIN, IRONPCTSAT Lipid Panel     Component Value Date/Time   CHOL 142 02/27/2018 0921   TRIG 46 02/27/2018 0921   HDL 64 02/27/2018 0921   CHOLHDL 2.4 09/15/2017 1007   LDLCALC 69 02/27/2018 0921   Hepatic Function Panel     Component Value Date/Time   PROT 7.1 02/27/2018 0921   ALBUMIN 4.3 02/27/2018 0921   AST 14 02/27/2018 0921   ALT 9 02/27/2018 0921   ALKPHOS 85 02/27/2018 0921   BILITOT 0.2 02/27/2018 0921      Component Value Date/Time   TSH 1.390 02/27/2018 0921   TSH 1.030 04/24/2017 1510      I, Burt KnackSharon Martin, am acting as Energy managertranscriptionist for [place]wn Sanya Kobrin, FNP-C  I have reviewed the above documentation for accuracy and completeness, and I agree with the above.  - Argel Pablo, FNP-C.

## 2018-07-11 ENCOUNTER — Other Ambulatory Visit (INDEPENDENT_AMBULATORY_CARE_PROVIDER_SITE_OTHER): Payer: Self-pay | Admitting: Physician Assistant

## 2018-07-11 DIAGNOSIS — E559 Vitamin D deficiency, unspecified: Secondary | ICD-10-CM

## 2018-07-26 ENCOUNTER — Ambulatory Visit (INDEPENDENT_AMBULATORY_CARE_PROVIDER_SITE_OTHER): Payer: 59 | Admitting: Physician Assistant

## 2018-07-26 ENCOUNTER — Other Ambulatory Visit: Payer: Self-pay

## 2018-07-26 ENCOUNTER — Encounter (INDEPENDENT_AMBULATORY_CARE_PROVIDER_SITE_OTHER): Payer: Self-pay | Admitting: Physician Assistant

## 2018-07-26 DIAGNOSIS — E559 Vitamin D deficiency, unspecified: Secondary | ICD-10-CM

## 2018-07-26 DIAGNOSIS — Z6830 Body mass index (BMI) 30.0-30.9, adult: Secondary | ICD-10-CM | POA: Diagnosis not present

## 2018-07-26 DIAGNOSIS — E669 Obesity, unspecified: Secondary | ICD-10-CM | POA: Diagnosis not present

## 2018-07-26 DIAGNOSIS — E8881 Metabolic syndrome: Secondary | ICD-10-CM

## 2018-07-26 NOTE — Addendum Note (Signed)
Addended by: Wilfrid Lund on: 07/26/2018 02:22 PM   Modules accepted: Orders

## 2018-07-30 NOTE — Progress Notes (Signed)
Office: [phone]  /  Fax: [phone] TeleHealth Visit:  Bethany Young has verbally consented to this TeleHealth visit today. The patient is located at home, the provider is located at the [employer] [facility] office. The participants in this visit include the listed provider and patient. The visit was conducted today via Webex.  HPI:   Chief Complaint: OBESITY Bethany Young is here to discuss her progress with her obesity treatment plan. She is keeping a food journal with 1200 calories and 85 grams of protein and is following her eating plan approximat6ely 50% of the time. She states she is exercising 0 is minutes 0 times per week. Bethany Young reports her weight is 195 lbs. She reports that the Effexor made her nauseous and she had a few vomiting episodes. She also reports some diarrhea, which is improving. During that time she was unable to get all of her food in. She states she is doing better with journaling.  We were unable to weigh the patient today for this TeleHealth visit. She feels as if she has maintained her weight since her last visit. She has lost 6 lbs since starting treatment with [name].  Vitamin D deficiency Bethany Young has a diagnosis of Vitamin D deficiency. She is currently taking prescription Vit D and denies nausea, vomiting or muscle weakness.  Insulin Resistance Bethany Young has a diagnosis of insulin resistance based on her elevated fasting insulin level >5. Although Bethany Young's blood glucose readings are still under good control, insulin resistance puts her at greater risk of metabolic syndrome and diabetes. She is taking metformin currently and continues to work on diet and exercise to decrease risk of diabetes. No nausea, vomiting, diarrhea, or polyphagia.  ASSESSMENT AND PLAN:  Vitamin D deficiency  Insulin resistance  Class 1 obesity with serious comorbidity and body mass index (BMI) of 30.0 to 30.9 in adult, unspecified obesity type  PLAN:  Vitamin D  Deficiency Bethany Young was informed that low Vitamin D levels contributes to fatigue and are associated with obesity, breast, and colon cancer. She agrees to continue to take prescription Vit D @ 50,000 IU every week #4 with 0 refills and will follow-up for routine testing of Vitamin D, at least 2-3 times per year. She was informed of the risk of over-replacement of Vitamin D and agrees to not increase her dose unless she discusses this with [name] first. Bethany Young agrees to follow-up with our clinic in 2 weeks.  Insulin Resistance Bethany Young will continue to work on weight loss, exercise, and decreasing simple carbohydrates in her diet to help decrease the risk of diabetes. We dicussed metformin including benefits and risks. She was informed that eating too many simple carbohydrates or too many calories at one sitting increases the likelihood of GI side effects. Bethany Young will continue with metformin and weight loss. She agrees to follow-up with [name] as directed to monitor her progress.  Obesity Bethany Young is currently in the action stage of change. As such, her goal is to continue with weight loss efforts. She has agreed to keep a food journal with 1200 calories and 85 grams of protein daily. Bethany Young has been instructed to work up to a goal of 150 minutes of combined cardio and strengthening exercise per week for weight loss and overall health benefits. We discussed the following Behavioral Modification Strategies today: no skipping meals, work on meal planning and easy cooking plans.  Bethany Young has agreed to follow-up with our clinic in 2 weeks. She was informed of the importance of frequent follow-up visits  to maximize her success with intensive lifestyle modifications for her multiple health conditions.  ALLERGIES: Allergies  Allergen Reactions   Ofloxacin Other (See Comments)    Burns eyes and makes them very irritated and does not help   Zoloft [Sertraline Hcl] Other (See Comments)    headache and fatigue     MEDICATIONS: Current Outpatient Medications on File Prior to Visit  Medication Sig Dispense Refill   diphenhydrAMINE (BENADRYL ALLERGY) 25 mg capsule Take 1 capsule (25 mg total) by mouth every 6 (six) hours as needed. 30 capsule 0   hydrocortisone cream 1 % Apply 1 application topically 2 (two) times daily. 30 g 0   metFORMIN (GLUCOPHAGE) 500 MG tablet Take 1 tablet (500 mg total) by mouth daily with breakfast. 90 tablet 0   venlafaxine XR (EFFEXOR XR) 37.5 MG 24 hr capsule Take 1 capsule (37.5 mg total) by mouth daily with breakfast. 30 capsule 0   Vitamin D, Ergocalciferol, (DRISDOL) 1.25 MG (50000 UT) CAPS capsule Take 1 capsule (50,000 Units total) by mouth every 7 (seven) days. 4 capsule 0   No current facility-administered medications on file prior to visit.     PAST MEDICAL HISTORY: Past Medical History:  Diagnosis Date   Anemia    Anxiety    Back pain    Chest pain    Dyspnea    Fatigue    Insomnia    Joint pain    Lower extremity edema     PAST SURGICAL HISTORY: Past Surgical History:  Procedure Laterality Date   EYELID REPAIR W/ SKIN GRAFT      SOCIAL HISTORY: Social History   Tobacco Use   Smoking status: Current Every Day Smoker    Types: E-cigarettes   Smokeless tobacco: Never Used  Substance Use Topics   Alcohol use: Yes    Comment: weekend drinker   Drug use: No    Types: Marijuana    FAMILY HISTORY: Family History  Problem Relation Age of Onset   Cancer Mother    Thyroid disease Mother    Depression Mother    Anxiety disorder Mother    Bipolar disorder Mother    Drug abuse Mother    Hypertension Father    Hyperlipidemia Father    Alcoholism Father    Drug abuse Father    Obesity Father    ROS: Review of Systems  Gastrointestinal: Negative for nausea and vomiting.  Musculoskeletal:       Negative for muscle weakness.  Endo/Heme/Allergies:       Negative for polyphagia.   PHYSICAL EXAM: Pt in no  acute distress  RECENT LABS AND TESTS: BMET    Component Value Date/Time   NA 141 02/27/2018 0921   K 4.1 02/27/2018 0921   CL 104 02/27/2018 0921   CO2 21 02/27/2018 0921   GLUCOSE 80 02/27/2018 0921   BUN 8 02/27/2018 0921   CREATININE 0.75 02/27/2018 0921   CALCIUM 9.6 02/27/2018 0921   GFRNONAA 111 02/27/2018 0921   GFRAA 128 02/27/2018 0921   Lab Results  Component Value Date   HGBA1C 5.3 02/27/2018   Lab Results  Component Value Date   INSULIN 25.1 (H) 02/27/2018   CBC    Component Value Date/Time   WBC 9.1 02/27/2018 0921   RBC 4.27 02/27/2018 0921   HGB 11.6 02/27/2018 0921   HCT 35.3 02/27/2018 0921   PLT 345 01/08/2018 1801   MCV 83 02/27/2018 0921   MCH 27.2 02/27/2018 0921   MCHC  32.9 02/27/2018 0921   RDW 13.5 02/27/2018 0921   LYMPHSABS 2.6 02/27/2018 0921   EOSABS 0.4 02/27/2018 0921   BASOSABS 0.1 02/27/2018 0921   Iron/TIBC/Ferritin/ %Sat No results found for: IRON, TIBC, FERRITIN, IRONPCTSAT Lipid Panel     Component Value Date/Time   CHOL 142 02/27/2018 0921   TRIG 46 02/27/2018 0921   HDL 64 02/27/2018 0921   CHOLHDL 2.4 09/15/2017 1007   LDLCALC 69 02/27/2018 0921   Hepatic Function Panel     Component Value Date/Time   PROT 7.1 02/27/2018 0921   ALBUMIN 4.3 02/27/2018 0921   AST 14 02/27/2018 0921   ALT 9 02/27/2018 0921   ALKPHOS 85 02/27/2018 0921   BILITOT 0.2 02/27/2018 0921      Component Value Date/Time   TSH 1.390 02/27/2018 0921   TSH 1.030 04/24/2017 1510   Results for Darrel ReachOWELL, Corisa (MRN [id number]) as of 07/30/2018 07:28  Ref. Range 02/27/2018 09:21  Vitamin D, 25-Hydroxy Latest Ref Range: 30.0 - 100.0 ng/mL 13.5 (L)   I, Marianna Paymentenise Haag, am acting as Energy managertranscriptionist for [employer] Aguilar, PA-C I, Alois Clicheracey Aguilar, PA-C have reviewed above note and agree with its content

## 2018-08-09 ENCOUNTER — Encounter (INDEPENDENT_AMBULATORY_CARE_PROVIDER_SITE_OTHER): Payer: Self-pay

## 2018-08-09 ENCOUNTER — Ambulatory Visit (INDEPENDENT_AMBULATORY_CARE_PROVIDER_SITE_OTHER): Payer: 59 | Admitting: Physician Assistant

## 2018-08-14 ENCOUNTER — Ambulatory Visit (INDEPENDENT_AMBULATORY_CARE_PROVIDER_SITE_OTHER)
Admission: RE | Admit: 2018-08-14 | Discharge: 2018-08-14 | Disposition: A | Discharge status: Home / Self Care | Payer: 59 | Source: Ambulatory Visit

## 2018-08-14 DIAGNOSIS — H9202 Otalgia, left ear: Secondary | ICD-10-CM | POA: Diagnosis not present

## 2018-08-14 DIAGNOSIS — H9192 Unspecified hearing loss, left ear: Secondary | ICD-10-CM | POA: Diagnosis not present

## 2018-08-14 NOTE — ED Provider Notes (Signed)
Virtual Visit via Video Note:  Bethany ReachJessica Hammes  initiated request for Telemedicine visit with [facility]. I connected with Bethany ReachJessica Hartinger  on 08/14/2018 at 8:42 AM  for a synchronized telemedicine visit using a video enabled HIPPA compliant telemedicine application. I verified that I am speaking with Bethany ReachJessica Cheaney  using two identifiers. Wallis BambergMario Charletta Voight, PA-C  was physically located in a [facility] site and Bethany ReachJessica Waybright was located at a different location.   The limitations of evaluation and management by telemedicine as well as the availability of in-person appointments were discussed. Patient was informed that she  may incur a bill ( including co-pay) for this virtual visit encounter. Bethany ReachJessica Handy  expressed understanding and gave verbal consent to proceed with virtual visit.     History of Present Illness:Bethany Young  is a 23 y.o. female presents with 2 day history of decreased hearing of her left ear. She tried to use Q-tips and cotton swabs, feels like she impacted earwax into her ear. Now has intermittent sharp moderate left ear pain. Reports intermittent drainage of her ear that occurs randomly every morning. Has excess wax that she has to wipe out.  She is used to having decreased hearing by the end of the evening but usually resolves by the next morning as after mentioned.  However, this episode is not resolving.   ROS Fever, sinus pain, sore throat, cough.  No current facility-administered medications for this encounter.    Current Outpatient Medications  Medication Sig Dispense Refill  . metFORMIN (GLUCOPHAGE) 500 MG tablet Take 1 tablet (500 mg total) by mouth daily with breakfast. 90 tablet 0  . venlafaxine XR (EFFEXOR XR) 37.5 MG 24 hr capsule Take 1 capsule (37.5 mg total) by mouth daily with breakfast. 30 capsule 0  . Vitamin D, Ergocalciferol, (DRISDOL) 1.25 MG (50000 UT) CAPS capsule Take 1 capsule (50,000 Units total) by mouth every 7 (seven)  days. 4 capsule 0     Allergies  Allergen Reactions  . Ofloxacin Other (See Comments)    Burns eyes and makes them very irritated and does not help  . Zoloft [Sertraline Hcl] Other (See Comments)    headache and fatigue     Past Medical History:  Diagnosis Date  . Anemia   . Anxiety   . Back pain   . Chest pain   . Dyspnea   . Fatigue   . Insomnia   . Joint pain   . Lower extremity edema     Past Surgical History:  Procedure Laterality Date  . EYELID REPAIR W/ SKIN GRAFT        Observations/Objective: Physical Exam Constitutional:      General: She is not in acute distress.    Appearance: Normal appearance. She is well-developed. She is not ill-appearing, toxic-appearing or diaphoretic.  Eyes:     Extraocular Movements: Extraocular movements intact.  Pulmonary:     Effort: Pulmonary effort is normal.  Neurological:     General: No focal deficit present.     Mental Status: She is alert and oriented to person, place, and time.  Psychiatric:        Mood and Affect: Mood normal.        Behavior: Behavior normal.        Thought Content: Thought content normal.        Judgment: Judgment normal.      Assessment and Plan:  1. Decreased hearing of left ear   2. Left  ear pain    I suspect patient has impacted cerumen but counseled that we would need to evaluate for infectious process as well as attempting ear lavage given her history.  Recommend the patient report to the urgent care today.  Patient states that she will try to make the appointment for today if not first thing tomorrow.  Use supportive care in the meantime.  Given that patient may or may not come in for her appointment today, will bill for tele-visit today.  Follow Up Instructions:    I discussed the assessment and treatment plan with the patient. The patient was provided an opportunity to ask questions and all were answered. The patient agreed with the plan and demonstrated an understanding of the  instructions.   The patient was advised to call back or seek an in-person evaluation if the symptoms worsen or if the condition fails to improve as anticipated.  I provided 10 minutes of non-face-to-face time during this encounter.    Jaynee Eagles, PA-C  08/14/2018 8:42 AM         Jaynee Eagles, PA-C 08/14/18 [phone]

## 2018-08-21 ENCOUNTER — Ambulatory Visit (INDEPENDENT_AMBULATORY_CARE_PROVIDER_SITE_OTHER): Payer: 59 | Admitting: Family Medicine

## 2018-08-21 ENCOUNTER — Encounter (INDEPENDENT_AMBULATORY_CARE_PROVIDER_SITE_OTHER): Payer: Self-pay | Admitting: Family Medicine

## 2018-08-21 ENCOUNTER — Other Ambulatory Visit: Payer: Self-pay

## 2018-08-21 VITALS — BP 104/71 | HR 74 | Temp 98.5°F | Ht 68.0 in | Wt 196.0 lb

## 2018-08-21 DIAGNOSIS — R7303 Prediabetes: Secondary | ICD-10-CM

## 2018-08-21 DIAGNOSIS — Z9189 Other specified personal risk factors, not elsewhere classified: Secondary | ICD-10-CM | POA: Diagnosis not present

## 2018-08-21 DIAGNOSIS — F418 Other specified anxiety disorders: Secondary | ICD-10-CM | POA: Diagnosis not present

## 2018-08-21 DIAGNOSIS — E669 Obesity, unspecified: Secondary | ICD-10-CM

## 2018-08-21 DIAGNOSIS — Z6830 Body mass index (BMI) 30.0-30.9, adult: Secondary | ICD-10-CM

## 2018-08-21 DIAGNOSIS — E66811 Obesity, class 1: Secondary | ICD-10-CM

## 2018-08-21 DIAGNOSIS — E559 Vitamin D deficiency, unspecified: Secondary | ICD-10-CM | POA: Diagnosis not present

## 2018-08-21 MED ORDER — VENLAFAXINE HCL ER 37.5 MG PO CP24: 37.5000 mg | ORAL_CAPSULE | Freq: Every day | ORAL | 0 refills | Status: DC

## 2018-08-21 MED ORDER — VITAMIN D (ERGOCALCIFEROL) 1.25 MG (50000 UNIT) PO CAPS: 50000.0000 [IU] | ORAL_CAPSULE | ORAL | 0 refills | Status: DC

## 2018-08-22 ENCOUNTER — Other Ambulatory Visit: Payer: Self-pay

## 2018-08-22 ENCOUNTER — Ambulatory Visit (INDEPENDENT_AMBULATORY_CARE_PROVIDER_SITE_OTHER): Payer: 59 | Admitting: Psychology

## 2018-08-22 DIAGNOSIS — F3289 Other specified depressive episodes: Secondary | ICD-10-CM | POA: Diagnosis not present

## 2018-08-22 LAB — INSULIN, RANDOM: INSULIN: 25.7 u[IU]/mL — ABNORMAL HIGH (ref 2.6–24.9)

## 2018-08-22 LAB — COMPREHENSIVE METABOLIC PANEL
ALT: 9 IU/L (ref 0–32)
AST: 10 IU/L (ref 0–40)
Albumin/Globulin Ratio: 1.8 (ref 1.2–2.2)
Albumin: 4.6 g/dL (ref 3.9–5.0)
Alkaline Phosphatase: 75 IU/L (ref 39–117)
BUN/Creatinine Ratio: 9 (ref 9–23)
BUN: 7 mg/dL (ref 6–20)
Bilirubin Total: 0.3 mg/dL (ref 0.0–1.2)
CO2: 22 mmol/L (ref 20–29)
Calcium: 9.6 mg/dL (ref 8.7–10.2)
Chloride: 104 mmol/L (ref 96–106)
Creatinine, Ser: 0.74 mg/dL (ref 0.57–1.00)
GFR calc Af Amer: 130 mL/min/{1.73_m2} (ref >59)
GFR calc non Af Amer: 113 mL/min/{1.73_m2} (ref >59)
Globulin, Total: 2.5 g/dL (ref 1.5–4.5)
Glucose: 86 mg/dL (ref 65–99)
Potassium: 4.2 mmol/L (ref 3.5–5.2)
Sodium: 141 mmol/L (ref 134–144)
Total Protein: 7.1 g/dL (ref 6.0–8.5)

## 2018-08-22 LAB — HEMOGLOBIN A1C
Est. average glucose Bld gHb Est-mCnc: 108 mg/dL
Hgb A1c MFr Bld: 5.4 % (ref 4.8–5.6)

## 2018-08-22 LAB — VITAMIN D 25 HYDROXY (VIT D DEFICIENCY, FRACTURES): Vit D, 25-Hydroxy: 29.2 ng/mL — ABNORMAL LOW (ref 30.0–100.0)
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
## 2018-08-22 NOTE — Progress Notes (Signed)
Office: [phone]  /  Fax: [phone]   HPI:   Chief Complaint: OBESITY Bethany Young is here to discuss her progress with her obesity treatment plan. She is on the Category 2 plan and is following her eating plan approximately 40 % of the time. She states she is doing yoga 20 minutes 5 times per week. Bethany Young states she does better on her plan on weekdays, but she does a lot of eating out and drinking on the weekends. Bethany Young journals on and off, but she is not meeting her protein or vegetable goals. Her weight is 196 lb (88.9 kg) today and has had a weight loss of 2 pounds since her last in-office visit. She has lost 8 lbs since starting treatment with us.  Vitamin D deficiency Bethany Young has a diagnosis of vitamin D deficiency. Bethany Young is stable on vit D and she is due for labs. Bethany Young denies nausea, vomiting or muscle weakness.  At risk for osteopenia and osteoporosis Bethany Young is at higher risk of osteopenia and osteoporosis due to vitamin D deficiency.   Pre-Diabetes Bethany Young has a diagnosis of prediabetes based on her elevated Hgb A1c and was informed this puts her at greater risk of developing diabetes. She is on metformin. Bethany Young is working on diet and weight loss, and she is due for labs. Bethany Young continues to work on diet and exercise to decrease the risk of diabetes. She still notes some polyphagia.  Depression with emotional eating behaviors [facility], but she hasn't been contacted in over two months. Patient suspects she might be bipolar, and this could affect her eating. She struggles with emotional eating and using food for comfort to the extent that it is negatively impacting her health. She often snacks when she is not hungry. Bethany Young sometimes feels she is out of control and then feels guilty that she made poor food choices. She has been working on behavior modification techniques to help reduce her emotional eating and has been somewhat  successful. She shows no sign of suicidal or homicidal ideations.  ASSESSMENT AND PLAN:  Vitamin D deficiency - Plan: VITAMIN D 25 Hydroxy (Vit-D Deficiency, Fractures), Vitamin D, Ergocalciferol, (DRISDOL) 1.25 MG (50000 UT) CAPS capsule  Prediabetes - Plan: Comprehensive metabolic panel, Hemoglobin A1c, Insulin, random  Depression with anxiety - Plan: venlafaxine XR (EFFEXOR XR) 37.5 MG 24 hr capsule  At risk for osteoporosis  Class 1 obesity with serious comorbidity and body mass index (BMI) of 30.0 to 30.9 in adult, unspecified obesity type  PLAN:  Vitamin D Deficiency Bethany Young was informed that low vitamin D levels contributes to fatigue and are associated with obesity, breast, and colon cancer. She agrees to continue to take prescription Vit D @50 ,000 IU every week #4 with no refills and will follow up for routine testing of vitamin D, at least 2-3 times per year. She was informed of the risk of over-replacement of vitamin D and agrees to not increase her dose unless she discusses this with us first. We will check labs and Bethany Young agrees to follow up with our clinic in 2 to 3 weeks.  At risk for osteopenia and osteoporosis Bethany Young was given extended  (15 minutes) osteoporosis prevention counseling today. Bethany Young is at risk for osteopenia and osteoporosis due to her vitamin D deficiency. She was encouraged to take her vitamin D and follow her higher calcium diet and increase strengthening exercise to help strengthen her bones and decrease her risk of osteopenia and osteoporosis.  Pre-Diabetes  Bethany Young will continue to work on weight loss, exercise, and decreasing simple carbohydrates in her diet to help decrease the risk of diabetes. She was informed that eating too many simple carbohydrates or too many calories at one sitting increases the likelihood of GI side effects. We will check labs and Bethany Young agrees to follow up with [name] as directed to monitor her progress.  Depression with  Emotional Eating Behaviors We discussed behavior modification techniques today to help Bethany Young deal with her emotional eating and depression. She has agreed to take Effexor XR 37.5 mg daily with breakfast #30 with no refills and follow up as directed. We will refer patient to Dr. Mallie Mussel for an initial evaluation.  Obesity Bethany Young is currently in the action stage of change. As such, her goal is to continue with weight loss efforts She has agreed to keep a food journal with 1200 to 1300 calories and 85+ grams of protein daily Bethany Young has been instructed to work up to a goal of 150 minutes of combined cardio and strengthening exercise per week for weight loss and overall health benefits. We discussed the following Behavioral Modification Strategies today: increasing lean protein intake, decreasing simple carbohydrates, increasing vegetables and work on meal planning and easy cooking plans  Bethany Young has agreed to follow up with our clinic in 2 to 3 weeks. She was informed of the importance of frequent follow up visits to maximize her success with intensive lifestyle modifications for her multiple health conditions.  ALLERGIES: Allergies  Allergen Reactions   Ofloxacin Other (See Comments)    Burns eyes and makes them very irritated and does not help   Zoloft [Sertraline Hcl] Other (See Comments)    headache and fatigue    MEDICATIONS: Current Outpatient Medications on File Prior to Visit  Medication Sig Dispense Refill   metFORMIN (GLUCOPHAGE) 500 MG tablet Take 1 tablet (500 mg total) by mouth daily with breakfast. 90 tablet 0   [DISCONTINUED] diphenhydrAMINE (BENADRYL ALLERGY) 25 mg capsule Take 1 capsule (25 mg total) by mouth every 6 (six) hours as needed. 30 capsule 0   No current facility-administered medications on file prior to visit.     PAST MEDICAL HISTORY: Past Medical History:  Diagnosis Date   Anemia    Anxiety    Back pain    Chest pain    Dyspnea    Fatigue     Insomnia    Joint pain    Lower extremity edema     PAST SURGICAL HISTORY: Past Surgical History:  Procedure Laterality Date   EYELID REPAIR W/ SKIN GRAFT      SOCIAL HISTORY: Social History   Tobacco Use   Smoking status: Current Every Day Smoker    Types: E-cigarettes   Smokeless tobacco: Never Used  Substance Use Topics   Alcohol use: Yes    Comment: weekend drinker   Drug use: No    Types: Marijuana    FAMILY HISTORY: Family History  Problem Relation Age of Onset   Cancer Mother    Thyroid disease Mother    Depression Mother    Anxiety disorder Mother    Bipolar disorder Mother    Drug abuse Mother    Hypertension Father    Hyperlipidemia Father    Alcoholism Father    Drug abuse Father    Obesity Father     ROS: Review of Systems  Constitutional: Positive for weight loss.  Gastrointestinal: Negative for nausea and vomiting.  Musculoskeletal:  Negative for muscle weakness  Endo/Heme/Allergies:       Positive for polyphagia  Psychiatric/Behavioral: Positive for depression. Negative for suicidal ideas.    PHYSICAL EXAM: Blood pressure 104/71, pulse 74, temperature 98.5 F (36.9 C), temperature source Oral, height 5\' 8"  (1.727 m), weight 196 lb (88.9 kg), SpO2 99 %. Body mass index is 29.8 kg/m. Physical Exam Vitals signs reviewed.  Constitutional:      Appearance: Normal appearance. She is well-developed. She is obese.  Cardiovascular:     Rate and Rhythm: Normal rate.  Pulmonary:     Effort: Pulmonary effort is normal.  Musculoskeletal: Normal range of motion.  Skin:    General: Skin is warm and dry.  Neurological:     Mental Status: She is alert and oriented to person, place, and time.  Psychiatric:        Mood and Affect: Mood normal.        Behavior: Behavior normal.        Thought Content: Thought content does not include homicidal or suicidal ideation.     RECENT LABS AND TESTS: BMET    Component Value  Date/Time   NA 141 08/21/2018 1129   K 4.2 08/21/2018 1129   CL 104 08/21/2018 1129   CO2 22 08/21/2018 1129   GLUCOSE 86 08/21/2018 1129   BUN 7 08/21/2018 1129   CREATININE 0.74 08/21/2018 1129   CALCIUM 9.6 08/21/2018 1129   GFRNONAA 113 08/21/2018 1129   GFRAA 130 08/21/2018 1129   Lab Results  Component Value Date   HGBA1C 5.4 08/21/2018   HGBA1C 5.3 02/27/2018   Lab Results  Component Value Date   INSULIN 25.7 (H) 08/21/2018   INSULIN 25.1 (H) 02/27/2018   CBC    Component Value Date/Time   WBC 9.1 02/27/2018 0921   RBC 4.27 02/27/2018 0921   HGB 11.6 02/27/2018 0921   HCT 35.3 02/27/2018 0921   PLT 345 01/08/2018 1801   MCV 83 02/27/2018 0921   MCH 27.2 02/27/2018 0921   MCHC 32.9 02/27/2018 0921   RDW 13.5 02/27/2018 0921   LYMPHSABS 2.6 02/27/2018 0921   EOSABS 0.4 02/27/2018 0921   BASOSABS 0.1 02/27/2018 0921   Iron/TIBC/Ferritin/ %Sat No results found for: IRON, TIBC, FERRITIN, IRONPCTSAT Lipid Panel     Component Value Date/Time   CHOL 142 02/27/2018 0921   TRIG 46 02/27/2018 0921   HDL 64 02/27/2018 0921   CHOLHDL 2.4 09/15/2017 1007   LDLCALC 69 02/27/2018 0921   Hepatic Function Panel     Component Value Date/Time   PROT 7.1 08/21/2018 1129   ALBUMIN 4.6 08/21/2018 1129   AST 10 08/21/2018 1129   ALT 9 08/21/2018 1129   ALKPHOS 75 08/21/2018 1129   BILITOT 0.3 08/21/2018 1129      Component Value Date/Time   TSH 1.390 02/27/2018 0921   TSH 1.030 04/24/2017 1510     Ref. Range 02/27/2018 09:21  Vitamin D, 25-Hydroxy Latest Ref Range: 30.0 - 100.0 ng/mL 13.5 (L)    OBESITY BEHAVIORAL INTERVENTION VISIT  Today's visit was # 12   Starting weight: 204 lbs Starting date: 02/27/2018 Today's weight : 196 lbs Today's date: 08/21/2018 Total lbs lost to date: 8    08/21/2018  Height 5\' 8"  (1.727 m)  Weight 196 lb (88.9 kg)  BMI (Calculated) 29.81  BLOOD PRESSURE - SYSTOLIC 104  BLOOD PRESSURE - DIASTOLIC 71   Body Fat % 40.1 %    Total Body Water (lbs) 81.6 lbs  ASK: We discussed the diagnosis of obesity with Darrel ReachJessica Eberle today and Bethany Young agreed to give us permission to discuss obesity behavioral modification therapy today.  ASSESS: Bethany Young has the diagnosis of obesity and her BMI today is 29.81 Bethany Young is in the action stage of change   ADVISE: Bethany Young was educated on the multiple health risks of obesity as well as the benefit of weight loss to improve her health. She was advised of the need for long term treatment and the importance of lifestyle modifications to improve her current health and to decrease her risk of future health problems.  AGREE: Multiple dietary modification options and treatment options were discussed and  Bethany Young agreed to follow the recommendations documented in the above note.  ARRANGE: Bethany Young was educated on the importance of frequent visits to treat obesity as outlined per CMS and USPSTF guidelines and agreed to schedule her next follow up appointment today.  I, [place] CraneJoanne Murray, am acting as transcriptionist for Quillian Quincearen Takeisha Cianci, MD  I have reviewed the above documentation for accuracy and completeness, and I agree with the above. -Quillian Quincearen Knut Rondinelli, MD

## 2018-08-31 ENCOUNTER — Encounter: Payer: Self-pay | Admitting: Nurse Practitioner

## 2018-09-03 NOTE — Telephone Encounter (Signed)
Please follow-up on paperwork.

## 2018-09-04 NOTE — Telephone Encounter (Signed)
CMA attempt to call patient regarding her concerns. No answer and LVM.

## 2018-09-05 NOTE — Telephone Encounter (Signed)
CMA LVM for patient to call back regarding the paperwork. No answer.

## 2018-09-10 ENCOUNTER — Ambulatory Visit: Payer: 59 | Admitting: Professional

## 2018-09-12 ENCOUNTER — Other Ambulatory Visit: Payer: Self-pay

## 2018-09-12 ENCOUNTER — Encounter (INDEPENDENT_AMBULATORY_CARE_PROVIDER_SITE_OTHER): Payer: Self-pay | Admitting: Family Medicine

## 2018-09-12 ENCOUNTER — Telehealth (INDEPENDENT_AMBULATORY_CARE_PROVIDER_SITE_OTHER): Payer: 59 | Admitting: Family Medicine

## 2018-09-12 DIAGNOSIS — F418 Other specified anxiety disorders: Secondary | ICD-10-CM

## 2018-09-12 DIAGNOSIS — E559 Vitamin D deficiency, unspecified: Secondary | ICD-10-CM

## 2018-09-12 DIAGNOSIS — Z6830 Body mass index (BMI) 30.0-30.9, adult: Secondary | ICD-10-CM | POA: Diagnosis not present

## 2018-09-12 DIAGNOSIS — E669 Obesity, unspecified: Secondary | ICD-10-CM | POA: Diagnosis not present

## 2018-09-12 MED ORDER — VENLAFAXINE HCL ER 37.5 MG PO CP24: 37.5000 mg | ORAL_CAPSULE | Freq: Every day | ORAL | 0 refills | Status: DC

## 2018-09-12 MED ORDER — VITAMIN D (ERGOCALCIFEROL) 1.25 MG (50000 UNIT) PO CAPS: 50000.0000 [IU] | ORAL_CAPSULE | ORAL | 0 refills | Status: DC

## 2018-09-13 NOTE — Progress Notes (Signed)
Office: [phone]  /  Fax: [phone] TeleHealth Visit:  Bethany Young has verbally consented to this TeleHealth visit today. The patient is located at home, the provider is located at the [facility] office. The participants in this visit include the listed provider and patient. The visit was conducted today via [email or web address].  HPI:   Chief Complaint: OBESITY Bethany Young is here to discuss her progress with her obesity treatment plan. She is on the keep a food journal with 1200-1300 calories and 85+ grams of protein daily and is following her eating plan approximately 50 % of the time. She states she is doing more physical activity. Bethany Young has done well maintaining her weight . There has been a lot of family drama in her life and her 2 teenage siblings are now living with her on and off, which has thrown off her normal meal planning.  We were unable to weigh the patient today for this TeleHealth visit. She feels as if she has maintained her weight since her last visit. She has lost 8 lbs since starting treatment with us.  Vitamin D Deficiency Bethany Young has a diagnosis of vitamin D deficiency. She is stable on prescription Vit D, but level is not yet at goal. She denies nausea, vomiting or muscle weakness.  Depression with Anxiety Bethany Young's mood is stable on Effexor. She has had some increased stress and has gotten off her routine, but hasn't started binge eating. She shows no sign of suicidal or homicidal ideations.  ASSESSMENT AND PLAN:  Class 1 obesity with serious comorbidity and body mass index (BMI) of 30.0 to 30.9 in adult, unspecified obesity type  Vitamin D deficiency - Plan: Vitamin D, Ergocalciferol, (DRISDOL) 1.25 MG (50000 UT) CAPS capsule  Depression with anxiety - Plan: venlafaxine XR (EFFEXOR XR) 37.5 MG 24 hr capsule  PLAN:  Vitamin D Deficiency Bethany Young was informed that low vitamin D levels contributes to fatigue and are associated with obesity, breast, and  colon cancer. Bethany Young agrees to continue taking prescription Vit D 50,000 IU every week #4 and we will refill for 1 month. She will follow up for routine testing of vitamin D, at least 2-3 times per year. She was informed of the risk of over-replacement of vitamin D and agrees to not increase her dose unless she discusses this with us first. Bethany Young agrees to follow up with our clinic in 2 weeks with Bethany Whitmire, FNP-C.  Depression with Anxiety We discussed behavior modification techniques today to help Bethany Young deal with heranxiety and depression. Bethany Young agrees to continue taking Effexor 37.5 mg q AM with breakfast #30 and we will refill for 1 month. Bethany Young agrees to follow up with our clinic in 2 weeks with Bethany Whitmire, FNP-C.  Obesity Bethany Young is currently in the action stage of change. As such, her goal is to continue with weight loss efforts She has agreed to follow the Category 2 plan Bethany Young has been instructed to work up to a goal of 150 minutes of combined cardio and strengthening exercise per week for weight loss and overall health benefits. We discussed the following Behavioral Modification Strategies today: increasing lean protein intake, decreasing simple carbohydrates , work on meal planning and easy cooking plans and dealing with family or coworker sabotage   Bethany Young has agreed to follow up with our clinic in 2 weeks with Bethany Whitmire, FNP-C. She was informed of the importance of frequent follow up visits to maximize her success with intensive lifestyle modifications for her multiple health  conditions.  ALLERGIES: Allergies  Allergen Reactions  . Ofloxacin Other (See Comments)    Burns eyes and makes them very irritated and does not help  . Zoloft [Sertraline Hcl] Other (See Comments)    headache and fatigue    MEDICATIONS: Current Outpatient Medications on File Prior to Visit  Medication Sig Dispense Refill  . metFORMIN (GLUCOPHAGE) 500 MG tablet Take 1 tablet (500 mg  total) by mouth daily with breakfast. 90 tablet 0  . [DISCONTINUED] diphenhydrAMINE (BENADRYL ALLERGY) 25 mg capsule Take 1 capsule (25 mg total) by mouth every 6 (six) hours as needed. 30 capsule 0   No current facility-administered medications on file prior to visit.     PAST MEDICAL HISTORY: Past Medical History:  Diagnosis Date  . Anemia   . Anxiety   . Back pain   . Chest pain   . Dyspnea   . Fatigue   . Insomnia   . Joint pain   . Lower extremity edema     PAST SURGICAL HISTORY: Past Surgical History:  Procedure Laterality Date  . EYELID REPAIR W/ SKIN GRAFT      SOCIAL HISTORY: Social History   Tobacco Use  . Smoking status: Current Every Day Smoker    Types: E-cigarettes  . Smokeless tobacco: Never Used  Substance Use Topics  . Alcohol use: Yes    Comment: weekend drinker  . Drug use: No    Types: Marijuana    FAMILY HISTORY: Family History  Problem Relation Age of Onset  . Cancer Mother   . Thyroid disease Mother   . Depression Mother   . Anxiety disorder Mother   . Bipolar disorder Mother   . Drug abuse Mother   . Hypertension Father   . Hyperlipidemia Father   . Alcoholism Father   . Drug abuse Father   . Obesity Father     ROS: Review of Systems  Constitutional: Negative for weight loss.  Gastrointestinal: Negative for nausea and vomiting.  Musculoskeletal:       Negative muscle weakness  Psychiatric/Behavioral: Positive for depression. Negative for suicidal ideas.    PHYSICAL EXAM: Pt in no acute distress  RECENT LABS AND TESTS: BMET    Component Value Date/Time   NA 141 08/21/2018 1129   K 4.2 08/21/2018 1129   CL 104 08/21/2018 1129   CO2 22 08/21/2018 1129   GLUCOSE 86 08/21/2018 1129   BUN 7 08/21/2018 1129   CREATININE 0.74 08/21/2018 1129   CALCIUM 9.6 08/21/2018 1129   GFRNONAA 113 08/21/2018 1129   GFRAA 130 08/21/2018 1129   Lab Results  Component Value Date   HGBA1C 5.4 08/21/2018   HGBA1C 5.3 02/27/2018    Lab Results  Component Value Date   INSULIN 25.7 (H) 08/21/2018   INSULIN 25.1 (H) 02/27/2018   CBC    Component Value Date/Time   WBC 9.1 02/27/2018 0921   RBC 4.27 02/27/2018 0921   HGB 11.6 02/27/2018 0921   HCT 35.3 02/27/2018 0921   PLT 345 01/08/2018 1801   MCV 83 02/27/2018 0921   MCH 27.2 02/27/2018 0921   MCHC 32.9 02/27/2018 0921   RDW 13.5 02/27/2018 0921   LYMPHSABS 2.6 02/27/2018 0921   EOSABS 0.4 02/27/2018 0921   BASOSABS 0.1 02/27/2018 0921   Iron/TIBC/Ferritin/ %Sat No results found for: IRON, TIBC, FERRITIN, IRONPCTSAT Lipid Panel     Component Value Date/Time   CHOL 142 02/27/2018 0921   TRIG 46 02/27/2018 0921   HDL 64  02/27/2018 0921   CHOLHDL 2.4 09/15/2017 1007   LDLCALC 69 02/27/2018 0921   Hepatic Function Panel     Component Value Date/Time   PROT 7.1 08/21/2018 1129   ALBUMIN 4.6 08/21/2018 1129   AST 10 08/21/2018 1129   ALT 9 08/21/2018 1129   ALKPHOS 75 08/21/2018 1129   BILITOT 0.3 08/21/2018 1129      Component Value Date/Time   TSH 1.390 02/27/2018 0921   TSH 1.030 04/24/2017 1510      I, Burt Knack, am acting as transcriptionist for Quillian Quince, MD I have reviewed the above documentation for accuracy and completeness, and I agree with the above. -Quillian Quince, MD

## 2018-09-26 ENCOUNTER — Ambulatory Visit (INDEPENDENT_AMBULATORY_CARE_PROVIDER_SITE_OTHER): Payer: 59 | Admitting: Family Medicine

## 2018-09-26 ENCOUNTER — Other Ambulatory Visit: Payer: Self-pay

## 2018-09-26 ENCOUNTER — Encounter (INDEPENDENT_AMBULATORY_CARE_PROVIDER_SITE_OTHER): Payer: Self-pay

## 2018-09-26 VITALS — BP 113/77 | HR 88 | Temp 98.9°F | Ht 68.0 in | Wt 202.0 lb

## 2018-09-26 DIAGNOSIS — Z9189 Other specified personal risk factors, not elsewhere classified: Secondary | ICD-10-CM | POA: Diagnosis not present

## 2018-09-26 DIAGNOSIS — Z6830 Body mass index (BMI) 30.0-30.9, adult: Secondary | ICD-10-CM

## 2018-09-26 DIAGNOSIS — E669 Obesity, unspecified: Secondary | ICD-10-CM

## 2018-09-26 DIAGNOSIS — E8881 Metabolic syndrome: Secondary | ICD-10-CM

## 2018-09-26 DIAGNOSIS — F418 Other specified anxiety disorders: Secondary | ICD-10-CM

## 2018-09-26 DIAGNOSIS — E88819 Insulin resistance, unspecified: Secondary | ICD-10-CM

## 2018-09-26 MED ORDER — VENLAFAXINE HCL ER 75 MG PO CP24: 75.0000 mg | ORAL_CAPSULE | Freq: Every day | ORAL | 0 refills | Status: DC

## 2018-09-26 NOTE — Progress Notes (Signed)
Office: [phone]  /  Fax: [phone]   HPI:   Chief Complaint: OBESITY [name] is here to discuss her progress with her obesity treatment plan. She is on the keep a food journal with 1250 calories and 80 grams of protein daily and the Category 2 plan and she is following her eating plan approximately 50 % of the time. She states she is exercising 0 minutes 0 times per week. [name] reports that she struggles with impulse control and she makes poor food choices. She does not eat her protein. Her younger siblings are currently living with her which is causing increased stress. It also causes her to have too many carbs in the house. Her weight is 202 lb (91.6 kg) today and has had a weight loss of 6 pounds since her last in-office visit. She has lost 2 lbs since starting treatment with us.  Insulin Resistance [name] has a diagnosis of insulin resistance based on her elevated fasting insulin level >5. Although Duanna's blood glucose readings are still under good control, insulin resistance puts her at greater risk of metabolic syndrome and diabetes. She admits to cravings and hunger. [name] admits to diarrhea on metformin. She continues to work on diet and exercise to decrease risk of diabetes.  At risk for diabetes [name] is at higher than average risk for developing diabetes due to her obesity and insulin resistance. She currently denies polyuria or polydipsia.  Depression with Anxiety [name] feels she has mood swings. She has struggled more recently since her siblings came to live with her. Her mother is bipolar. She is currently taking 37.5 mg of Effexor daily. She does not feel that it is helping much though she is tolerating it well. [name] shows no sign of suicidal or homicidal ideations.  ASSESSMENT AND PLAN:  Insulin resistance  Depression with anxiety - Plan: venlafaxine XR (EFFEXOR XR) 75 MG 24 hr capsule, Ambulatory referral to Psychiatry  At risk for diabetes  mellitus  Class 1 obesity with serious comorbidity and body mass index (BMI) of 30.0 to 30.9 in adult, unspecified obesity type  PLAN:  Insulin Resistance [name] will continue to work on weight loss, exercise, and decreasing simple carbohydrates in her diet to help decrease the risk of diabetes. We dicussed metformin including benefits and risks. She was informed that eating too many simple carbohydrates or too many calories at one sitting increases the likelihood of GI side effects. [name] agrees to increase her metformin dose to two times daily (no refill needed at this time) and follow up with us as directed to monitor her progress.  Diabetes risk counseling [name] was given extended (15 minutes) diabetes prevention counseling today. She is 23 y.o. female and has risk factors for diabetes including obesity and insulin resistance. We discussed intensive lifestyle modifications today with an emphasis on weight loss as well as increasing exercise and decreasing simple carbohydrates in her diet.  Depression with Anxiety [name] has agreed to increase her dose of Effexor to 75 mg daily #30 with no refills and will set up with psychiatry. [name] agreed to follow up as directed.   Obesity [name] is currently in the action stage of change. As such, her goal is to continue with weight loss efforts She has agreed to keep a food journal with 1250 to 1300 calories and 80 grams of protein daily We discussed the following Behavioral Modification Strategies today: planning for success, increasing lean protein intake and decreasing simple carbohydrates   [name] has agreed to follow  up with our clinic in 2 to 3 weeks. She was informed of the importance of frequent follow up visits to maximize her success with intensive lifestyle modifications for her multiple health conditions.  ALLERGIES: Allergies  Allergen Reactions   Ofloxacin Other (See Comments)    Burns eyes and makes them very irritated  and does not help   Zoloft [Sertraline Hcl] Other (See Comments)    headache and fatigue    MEDICATIONS: Current Outpatient Medications on File Prior to Visit  Medication Sig Dispense Refill   metFORMIN (GLUCOPHAGE) 500 MG tablet Take 1 tablet (500 mg total) by mouth daily with breakfast. 90 tablet 0   Vitamin D, Ergocalciferol, (DRISDOL) 1.25 MG (50000 UT) CAPS capsule Take 1 capsule (50,000 Units total) by mouth every 7 (seven) days. 4 capsule 0   [DISCONTINUED] diphenhydrAMINE (BENADRYL ALLERGY) 25 mg capsule Take 1 capsule (25 mg total) by mouth every 6 (six) hours as needed. 30 capsule 0   No current facility-administered medications on file prior to visit.     PAST MEDICAL HISTORY: Past Medical History:  Diagnosis Date   Anemia    Anxiety    Back pain    Chest pain    Dyspnea    Fatigue    Insomnia    Joint pain    Lower extremity edema     PAST SURGICAL HISTORY: Past Surgical History:  Procedure Laterality Date   EYELID REPAIR W/ SKIN GRAFT      SOCIAL HISTORY: Social History   Tobacco Use   Smoking status: Current Every Day Smoker    Types: E-cigarettes   Smokeless tobacco: Never Used  Substance Use Topics   Alcohol use: Yes    Comment: weekend drinker   Drug use: No    Types: Marijuana    FAMILY HISTORY: Family History  Problem Relation Age of Onset   Cancer Mother    Thyroid disease Mother    Depression Mother    Anxiety disorder Mother    Bipolar disorder Mother    Drug abuse Mother    Hypertension Father    Hyperlipidemia Father    Alcoholism Father    Drug abuse Father    Obesity Father     ROS: Review of Systems  Constitutional: Negative for weight loss.  Gastrointestinal: Positive for diarrhea.  Genitourinary: Negative for frequency.  Endo/Heme/Allergies: Negative for polydipsia.       Positive for polyphagia Positive for cravings  Psychiatric/Behavioral: Positive for depression. Negative for suicidal  ideas.    PHYSICAL EXAM: Blood pressure 113/77, pulse 88, temperature 98.9 F (37.2 C), temperature source Oral, height 5\' 8"  (1.727 m), weight 202 lb (91.6 kg), SpO2 96 %. Body mass index is 30.71 kg/m. Physical Exam Vitals signs reviewed.  Constitutional:      Appearance: Normal appearance. She is well-developed. She is obese.  Cardiovascular:     Rate and Rhythm: Normal rate.  Pulmonary:     Effort: Pulmonary effort is normal.  Musculoskeletal: Normal range of motion.  Skin:    General: Skin is warm and dry.  Neurological:     Mental Status: She is alert and oriented to person, place, and time.  Psychiatric:        Mood and Affect: Mood normal.        Behavior: Behavior normal.        Thought Content: Thought content does not include homicidal or suicidal ideation.     RECENT LABS AND TESTS: BMET    Component  Value Date/Time   NA 141 08/21/2018 1129   K 4.2 08/21/2018 1129   CL 104 08/21/2018 1129   CO2 22 08/21/2018 1129   GLUCOSE 86 08/21/2018 1129   BUN 7 08/21/2018 1129   CREATININE 0.74 08/21/2018 1129   CALCIUM 9.6 08/21/2018 1129   GFRNONAA 113 08/21/2018 1129   GFRAA 130 08/21/2018 1129   Lab Results  Component Value Date   HGBA1C 5.4 08/21/2018   HGBA1C 5.3 02/27/2018   Lab Results  Component Value Date   INSULIN 25.7 (H) 08/21/2018   INSULIN 25.1 (H) 02/27/2018   CBC    Component Value Date/Time   WBC 9.1 02/27/2018 0921   RBC 4.27 02/27/2018 0921   HGB 11.6 02/27/2018 0921   HCT 35.3 02/27/2018 0921   PLT 345 01/08/2018 1801   MCV 83 02/27/2018 0921   MCH 27.2 02/27/2018 0921   MCHC 32.9 02/27/2018 0921   RDW 13.5 02/27/2018 0921   LYMPHSABS 2.6 02/27/2018 0921   EOSABS 0.4 02/27/2018 0921   BASOSABS 0.1 02/27/2018 0921   Iron/TIBC/Ferritin/ %Sat No results found for: IRON, TIBC, FERRITIN, IRONPCTSAT Lipid Panel     Component Value Date/Time   CHOL 142 02/27/2018 0921   TRIG 46 02/27/2018 0921   HDL 64 02/27/2018 0921    CHOLHDL 2.4 09/15/2017 1007   LDLCALC 69 02/27/2018 0921   Hepatic Function Panel     Component Value Date/Time   PROT 7.1 08/21/2018 1129   ALBUMIN 4.6 08/21/2018 1129   AST 10 08/21/2018 1129   ALT 9 08/21/2018 1129   ALKPHOS 75 08/21/2018 1129   BILITOT 0.3 08/21/2018 1129      Component Value Date/Time   TSH 1.390 02/27/2018 0921   TSH 1.030 04/24/2017 1510    Results for FRIMY, UFFELMAN (MRN [id number]) as of 09/26/2018 17:19  Ref. Range 08/21/2018 11:29  Vitamin D, 25-Hydroxy Latest Ref Range: 30.0 - 100.0 ng/mL 29.2 (L)    OBESITY BEHAVIORAL INTERVENTION VISIT  Today's visit was # 14   Starting weight: 204 lbs Starting date: 02/27/2018 Today's weight : 202 lbs Today's date: 09/26/2018 Total lbs lost to date: 2    09/26/2018  Height 5\' 8"  (1.727 m)  Weight 202 lb (91.6 kg)  BMI (Calculated) 30.72  BLOOD PRESSURE - SYSTOLIC 824  BLOOD PRESSURE - DIASTOLIC 77   Body Fat % 23.5 %  Total Body Water (lbs) 83.2 lbs    ASK: We discussed the diagnosis of obesity with Orvil Feil today and Bria agreed to give [name] permission to discuss obesity behavioral modification therapy today.  ASSESS: Anvi has the diagnosis of obesity and her BMI today is 30.72 Patrena is in the action stage of change   ADVISE: Kareem was educated on the multiple health risks of obesity as well as the benefit of weight loss to improve her health. She was advised of the need for long term treatment and the importance of lifestyle modifications to improve her current health and to decrease her risk of future health problems.  AGREE: Multiple dietary modification options and treatment options were discussed and  Yasmine agreed to follow the recommendations documented in the above note.  ARRANGE: Kewanna was educated on the importance of frequent visits to treat obesity as outlined per CMS and USPSTF guidelines and agreed to schedule her next follow up appointment today.  Corey Skains, am  acting as transcriptionist for [employer], FNP-C  I have reviewed the above documentation for accuracy and completeness, and I  agree with the above.  - Dawn Whitmire, FNP-C.

## 2018-09-27 ENCOUNTER — Encounter (INDEPENDENT_AMBULATORY_CARE_PROVIDER_SITE_OTHER): Payer: Self-pay | Admitting: Family Medicine

## 2018-10-10 ENCOUNTER — Other Ambulatory Visit (INDEPENDENT_AMBULATORY_CARE_PROVIDER_SITE_OTHER): Payer: Self-pay | Admitting: Family Medicine

## 2018-10-10 DIAGNOSIS — E559 Vitamin D deficiency, unspecified: Secondary | ICD-10-CM

## 2018-10-12 ENCOUNTER — Other Ambulatory Visit (INDEPENDENT_AMBULATORY_CARE_PROVIDER_SITE_OTHER): Payer: Self-pay | Admitting: Family Medicine

## 2018-10-12 DIAGNOSIS — F418 Other specified anxiety disorders: Secondary | ICD-10-CM

## 2018-10-17 ENCOUNTER — Ambulatory Visit (INDEPENDENT_AMBULATORY_CARE_PROVIDER_SITE_OTHER): Payer: 59 | Admitting: Family Medicine

## 2018-10-17 ENCOUNTER — Other Ambulatory Visit: Payer: Self-pay

## 2018-10-17 ENCOUNTER — Encounter (INDEPENDENT_AMBULATORY_CARE_PROVIDER_SITE_OTHER): Payer: Self-pay | Admitting: Family Medicine

## 2018-10-17 VITALS — BP 103/70 | HR 86 | Temp 98.0°F | Ht 68.0 in | Wt 198.0 lb

## 2018-10-17 DIAGNOSIS — Z9189 Other specified personal risk factors, not elsewhere classified: Secondary | ICD-10-CM | POA: Diagnosis not present

## 2018-10-17 DIAGNOSIS — E8881 Metabolic syndrome: Secondary | ICD-10-CM

## 2018-10-17 DIAGNOSIS — E669 Obesity, unspecified: Secondary | ICD-10-CM

## 2018-10-17 DIAGNOSIS — E559 Vitamin D deficiency, unspecified: Secondary | ICD-10-CM | POA: Diagnosis not present

## 2018-10-17 DIAGNOSIS — Z6830 Body mass index (BMI) 30.0-30.9, adult: Secondary | ICD-10-CM

## 2018-10-17 DIAGNOSIS — E88819 Insulin resistance, unspecified: Secondary | ICD-10-CM

## 2018-10-17 DIAGNOSIS — F418 Other specified anxiety disorders: Secondary | ICD-10-CM | POA: Diagnosis not present

## 2018-10-17 MED ORDER — VITAMIN D (ERGOCALCIFEROL) 1.25 MG (50000 UNIT) PO CAPS: 50000.0000 [IU] | ORAL_CAPSULE | ORAL | 0 refills | Status: DC

## 2018-10-17 MED ORDER — ESCITALOPRAM OXALATE 5 MG PO TABS: 5.0000 mg | ORAL_TABLET | Freq: Every day | ORAL | 0 refills | Status: DC

## 2018-10-17 NOTE — Progress Notes (Signed)
Office: [phone]  /  Fax: [phone]   HPI:   Chief Complaint: OBESITY Bethany Young is here to discuss her progress with her obesity treatment plan. She is on the keep a food journal with 1250-1300 calories and 80 grams of protein daily and is following her eating plan approximately 30 % of the time. She states she is exercising 0 minutes 0 times per week. Bethany Young is not eating at times. She feels fatigued and is skipping meals. She feels this is due to stress and depression due to personal issues. Her weight is 198 lb (89.8 kg) today and has had a weight loss of 4 pounds over a period of 3 weeks since her last visit. She has lost 6 lbs since starting treatment with [name].  Insulin Resistance Bethany Young has a diagnosis of insulin resistance based on her elevated fasting insulin level >5. Although Bethany Young's blood glucose readings are still under good control, insulin resistance puts her at greater risk of metabolic syndrome and diabetes. She is taking metformin once daily and continues to work on diet and exercise to decrease risk of diabetes. She denies polyphagia. Lab Results  Component Value Date   HGBA1C 5.4 08/21/2018     At risk for diabetes Bethany Young is at higher than average risk for developing diabetes due to her obesity and insulin resistance. She currently denies polyuria or polydipsia.  Depression with Anxiety Bethany Young reports she had a "breakdown" the other day due to stressors. She feels she does not want to eat at times because of stress. She feels this may be due to depression. Her mother has bipolar disorder and she feels she exhibits some of the traits of this. She notes fatigue. She is unable to afford to see psych at this time because she has a very high deductible. She will have new insurance January 1 and hopes she will be able to see a psychiatrist at this time. She is currently on Effexor and I increased her dose to 75 mg last visit. She does not think the Effexor is helping  at all. She shows no sign of suicidal or homicidal ideations.  ASSESSMENT AND PLAN:  Insulin resistance  Vitamin D deficiency - Plan: Vitamin D, Ergocalciferol, (DRISDOL) 1.25 MG (50000 UT) CAPS capsule  Depression with anxiety - Plan: escitalopram (LEXAPRO) 5 MG tablet  At risk for diabetes mellitus  Class 1 obesity with serious comorbidity and body mass index (BMI) of 30.0 to 30.9 in adult, unspecified obesity type  PLAN:  Insulin Resistance Bethany Young will continue to work on weight loss, exercise, and decreasing simple carbohydrates in her diet to help decrease the risk of diabetes. Bethany Young agrees to continue taking metformin, and she agrees to follow up with our clinic in 3 weeks as directed to monitor her progress.  Diabetes risk counseling Bethany Young was given extended (15 minutes) diabetes prevention counseling today. She is 23 y.o. female and has risk factors for diabetes including obesity and insulin resistance. We discussed intensive lifestyle modifications today with an emphasis on weight loss as well as increasing exercise and decreasing simple carbohydrates in her diet.  Depression with Anxiety We discussed behavior modification techniques today to help Bethany Young deal with her emotional eating and depression. Bethany Young agrees to discontinue Effexor, and she agrees to start Lexapro 5 mg daily #30 with no refills. Bethany Young agrees to follow up with our clinic in 3 weeks.  Obesity Bethany Young is currently in the action stage of change. As such, her goal is to continue with  weight loss efforts She has agreed to keep a food journal with 1250-1300 calories and 80 grams of protein daily Bethany Young was advised to eat 3 meals a day with protein. Bethany Young has been instructed to work up to a goal of 150 minutes of combined cardio and strengthening exercise per week for weight loss and overall health benefits. We discussed the following Behavioral Modification Strategies today: increasing lean protein  intake, no skipping meals, and planning for success   Bethany Young has agreed to follow up with our clinic in 3 weeks. She was informed of the importance of frequent follow up visits to maximize her success with intensive lifestyle modifications for her multiple health conditions.  ALLERGIES: Allergies  Allergen Reactions   Ofloxacin Other (See Comments)    Burns eyes and makes them very irritated and does not help   Zoloft [Sertraline Hcl] Other (See Comments)    headache and fatigue    MEDICATIONS: Current Outpatient Medications on File Prior to Visit  Medication Sig Dispense Refill   metFORMIN (GLUCOPHAGE) 500 MG tablet Take 1 tablet (500 mg total) by mouth daily with breakfast. 90 tablet 0   [DISCONTINUED] diphenhydrAMINE (BENADRYL ALLERGY) 25 mg capsule Take 1 capsule (25 mg total) by mouth every 6 (six) hours as needed. 30 capsule 0   No current facility-administered medications on file prior to visit.     PAST MEDICAL HISTORY: Past Medical History:  Diagnosis Date   Anemia    Anxiety    Back pain    Chest pain    Dyspnea    Fatigue    Insomnia    Joint pain    Lower extremity edema     PAST SURGICAL HISTORY: Past Surgical History:  Procedure Laterality Date   EYELID REPAIR Bethany/ SKIN GRAFT      SOCIAL HISTORY: Social History   Tobacco Use   Smoking status: Current Every Day Smoker    Types: E-cigarettes   Smokeless tobacco: Never Used  Substance Use Topics   Alcohol use: Yes    Comment: weekend drinker   Drug use: No    Types: Marijuana    FAMILY HISTORY: Family History  Problem Relation Age of Onset   Cancer Mother    Thyroid disease Mother    Depression Mother    Anxiety disorder Mother    Bipolar disorder Mother    Drug abuse Mother    Hypertension Father    Hyperlipidemia Father    Alcoholism Father    Drug abuse Father    Obesity Father     ROS: Review of Systems  Constitutional: Positive for malaise/fatigue  and weight loss.  Genitourinary: Negative for frequency.  Endo/Heme/Allergies: Negative for polydipsia.       Negative polyphagia  Psychiatric/Behavioral: Positive for depression. Negative for suicidal ideas.       + Anxiety    PHYSICAL EXAM: Blood pressure 103/70, pulse 86, temperature 98 F (36.7 C), temperature source Oral, height 5\' 8"  (1.727 m), weight 198 lb (89.8 kg), SpO2 100 %. Body mass index is 30.11 kg/m. Physical Exam Vitals signs reviewed.  Constitutional:      Appearance: Normal appearance. She is obese.  Cardiovascular:     Rate and Rhythm: Normal rate.     Pulses: Normal pulses.  Pulmonary:     Effort: Pulmonary effort is normal.     Breath sounds: Normal breath sounds.  Musculoskeletal: Normal range of motion.  Skin:    General: Skin is warm and dry.  Neurological:  Mental Status: She is alert and oriented to person, place, and time.  Psychiatric:        Mood and Affect: Mood normal.        Behavior: Behavior normal.     RECENT LABS AND TESTS: BMET    Component Value Date/Time   NA 141 08/21/2018 1129   K 4.2 08/21/2018 1129   CL 104 08/21/2018 1129   CO2 22 08/21/2018 1129   GLUCOSE 86 08/21/2018 1129   BUN 7 08/21/2018 1129   CREATININE 0.74 08/21/2018 1129   CALCIUM 9.6 08/21/2018 1129   GFRNONAA 113 08/21/2018 1129   GFRAA 130 08/21/2018 1129   Lab Results  Component Value Date   HGBA1C 5.4 08/21/2018   HGBA1C 5.3 02/27/2018   Lab Results  Component Value Date   INSULIN 25.7 (H) 08/21/2018   INSULIN 25.1 (H) 02/27/2018   CBC    Component Value Date/Time   WBC 9.1 02/27/2018 0921   RBC 4.27 02/27/2018 0921   HGB 11.6 02/27/2018 0921   HCT 35.3 02/27/2018 0921   PLT 345 01/08/2018 1801   MCV 83 02/27/2018 0921   MCH 27.2 02/27/2018 0921   MCHC 32.9 02/27/2018 0921   RDW 13.5 02/27/2018 0921   LYMPHSABS 2.6 02/27/2018 0921   EOSABS 0.4 02/27/2018 0921   BASOSABS 0.1 02/27/2018 0921   Iron/TIBC/Ferritin/ %Sat No results  found for: IRON, TIBC, FERRITIN, IRONPCTSAT Lipid Panel     Component Value Date/Time   CHOL 142 02/27/2018 0921   TRIG 46 02/27/2018 0921   HDL 64 02/27/2018 0921   CHOLHDL 2.4 09/15/2017 1007   LDLCALC 69 02/27/2018 0921   Hepatic Function Panel     Component Value Date/Time   PROT 7.1 08/21/2018 1129   ALBUMIN 4.6 08/21/2018 1129   AST 10 08/21/2018 1129   ALT 9 08/21/2018 1129   ALKPHOS 75 08/21/2018 1129   BILITOT 0.3 08/21/2018 1129      Component Value Date/Time   TSH 1.390 02/27/2018 0921   TSH 1.030 04/24/2017 1510      OBESITY BEHAVIORAL INTERVENTION VISIT  Today's visit was # 15   Starting weight: 204 lbs Starting date: 02/27/2018 Today's weight : 198 lbs  Today's date: 10/17/2018 Total lbs lost to date: 6    ASK: We discussed the diagnosis of obesity with Orvil Feil today and Arlis agreed to give [name] permission to discuss obesity behavioral modification therapy today.  ASSESS: Juhi has the diagnosis of obesity and her BMI today is 30.11 Kirti is in the action stage of change   ADVISE: Tasmia was educated on the multiple health risks of obesity as well as the benefit of weight loss to improve her health. She was advised of the need for long term treatment and the importance of lifestyle modifications to improve her current health and to decrease her risk of future health problems.  AGREE: Multiple dietary modification options and treatment options were discussed and  Breelle agreed to follow the recommendations documented in the above note.  ARRANGE: Johnika was educated on the importance of frequent visits to treat obesity as outlined per CMS and USPSTF guidelines and agreed to schedule her next follow up appointment today.  Wilhemena Durie, am acting as transcriptionist for [employer], FNP-C  I have reviewed the above documentation for accuracy and completeness, and I agree with the above.  - Symeon Puleo, FNP-C.

## 2018-11-07 ENCOUNTER — Other Ambulatory Visit: Payer: Self-pay

## 2018-11-07 ENCOUNTER — Encounter (INDEPENDENT_AMBULATORY_CARE_PROVIDER_SITE_OTHER): Payer: Self-pay | Admitting: Family Medicine

## 2018-11-07 ENCOUNTER — Ambulatory Visit (INDEPENDENT_AMBULATORY_CARE_PROVIDER_SITE_OTHER): Payer: 59 | Admitting: Family Medicine

## 2018-11-07 VITALS — BP 100/64 | HR 89 | Temp 98.7°F | Ht 68.0 in | Wt 199.0 lb

## 2018-11-07 DIAGNOSIS — Z9189 Other specified personal risk factors, not elsewhere classified: Secondary | ICD-10-CM | POA: Diagnosis not present

## 2018-11-07 DIAGNOSIS — E559 Vitamin D deficiency, unspecified: Secondary | ICD-10-CM | POA: Diagnosis not present

## 2018-11-07 DIAGNOSIS — F418 Other specified anxiety disorders: Secondary | ICD-10-CM | POA: Diagnosis not present

## 2018-11-07 DIAGNOSIS — Z6830 Body mass index (BMI) 30.0-30.9, adult: Secondary | ICD-10-CM

## 2018-11-07 DIAGNOSIS — E669 Obesity, unspecified: Secondary | ICD-10-CM

## 2018-11-07 DIAGNOSIS — E88819 Insulin resistance, unspecified: Secondary | ICD-10-CM

## 2018-11-07 DIAGNOSIS — E8881 Metabolic syndrome: Secondary | ICD-10-CM

## 2018-11-07 MED ORDER — VITAMIN D (ERGOCALCIFEROL) 1.25 MG (50000 UNIT) PO CAPS: 50000.0000 [IU] | ORAL_CAPSULE | ORAL | 0 refills | Status: DC

## 2018-11-07 MED ORDER — METFORMIN HCL 500 MG PO TABS: 500.0000 mg | ORAL_TABLET | Freq: Every day | ORAL | 0 refills | Status: DC

## 2018-11-07 MED ORDER — ESCITALOPRAM OXALATE 5 MG PO TABS: 5.0000 mg | ORAL_TABLET | Freq: Every day | ORAL | 0 refills | Status: DC

## 2018-11-12 NOTE — Progress Notes (Signed)
Office: [phone]  /  Fax: [phone]   HPI:   Chief Complaint: OBESITY Bethany Young is here to discuss her progress with her obesity treatment plan. She is on the keep a food journal with 1200 calories and 80 grams of protein daily plan and she is following her eating plan approximately 50 % of the time. She states she is exercising 0 minutes 0 times per week. Bethany Young is getting her protein in about half the time. She does not feel hungry and she tends to skip meals. Bethany Young denies eating too many simple carbohydrates. She is interested in making home made protein shakes. Her weight is 199 lb (90.3 kg) today and has had a weight gain of 1 pound over a period of 3 weeks since her last visit. She has lost 5 lbs since starting treatment with Bethany Young.  Vitamin D deficiency Bethany Young has a diagnosis of vitamin D deficiency. Her last vitamin D level was low at 29.2 on 08/21/18. Bethany Young is currently taking vit D and she denies nausea, vomiting or muscle weakness.  At risk for osteopenia and osteoporosis Bethany Young is at higher risk of osteopenia and osteoporosis due to vitamin D deficiency.   Insulin Resistance Bethany Young has a diagnosis of insulin resistance based on her elevated fasting insulin level >5. Although Bethany Young's blood glucose readings are still under good control, insulin resistance puts her at greater risk of metabolic syndrome and diabetes. Bethany Young is on metformin. She feels she is too hungry without it, but she is skipping meals. She continues to work on diet and exercise to decrease risk of diabetes.  Depression with Anxiety Bethany Young feels Lexapro is working very well. Her mood is stable. She will make an appointment for psychiatry the first of the year whne she gets new insurance coverage. . She shows no sign of suicidal or homicidal ideations.  ASSESSMENT AND PLAN:  Insulin resistance - Plan: metFORMIN (GLUCOPHAGE) 500 MG tablet  Vitamin D deficiency - Plan: Vitamin D, Ergocalciferol,  (DRISDOL) 1.25 MG (50000 UT) CAPS capsule  Depression with anxiety - Plan: escitalopram (LEXAPRO) 5 MG tablet  At risk for osteoporosis  Class 1 obesity with serious comorbidity and body mass index (BMI) of 30.0 to 30.9 in adult, unspecified obesity type  PLAN:  Vitamin D Deficiency Bethany Young was informed that low vitamin D levels contributes to fatigue and are associated with obesity, breast, and colon cancer. Bethany Young agrees to continue to take prescription Vit D @50 ,000 IU every week #12 with no refills and she will follow up for routine testing of vitamin D, at least 2-3 times per year. She was informed of the risk of over-replacement of vitamin D and agrees to not increase her dose unless she discusses this with Bethany Young first. We will check vitamin D level at her visit in January. Bethany Young agrees to follow up as directed.  At risk for osteopenia and osteoporosis Bethany Young was given extended  (15 minutes) osteoporosis prevention counseling today. Bethany Young is at risk for osteopenia and osteoporosis due to her vitamin D deficiency. She was encouraged to take her vitamin D and follow her higher calcium diet and increase strengthening exercise to help strengthen her bones and decrease her risk of osteopenia and osteoporosis.  Insulin Resistance Bethany Young will continue to work on weight loss, exercise, and decreasing simple carbohydrates in her diet to help decrease the risk of diabetes. We discussed metformin including benefits and risks. She was informed that eating too many simple carbohydrates or too many calories at one sitting increases  the likelihood of GI side effects. Bethany Young agrees to take 1/2 of metformin (250 mg daily) and refill of metformin 500 mg qAM #90 with no refills was written today. Bethany Young agrees to follow up with [name] as directed to monitor her progress.  Depression with Anxiety We discussed behavior modification techniques today to help Bethany Young deal with her anxiety and depression. She  has agreed to take Lexapro 5 mg qd #90 with no refills and follow up as directed.  Obesity Bethany Young is currently in the action stage of change. As such, her goal is to continue with weight loss efforts She has agreed to portion control better and make smarter food choices, such as increase vegetables and decrease simple carbohydrates and keep a food journal with 1200 to 1300 calories and 80 grams of protein daily Bethany Young will walk for 30 minutes, 3 days per week for weight loss and overall health benefits. We discussed the following Behavioral Modification Strategies today: planning for success, no skipping meals, increasing lean protein intake and decreasing simple carbohydrates  Bethany Young may have a protein shake or bar for one meal daily with at least 20 grams of protein. She may make her own shakes with protein powder. I did tell her that it would be better to eat a meal rather than drink a shake.   Bethany Young has agreed to follow up with our clinic in 12 weeks. She was informed of the importance of frequent follow up visits to maximize her success with intensive lifestyle modifications for her multiple health conditions. She needs to make her next visit in January due to a lack of insurance coverage for these visits.  ALLERGIES: Allergies  Allergen Reactions  . Ofloxacin Other (See Comments)    Burns eyes and makes them very irritated and does not help  . Zoloft [Sertraline Hcl] Other (See Comments)    headache and fatigue    MEDICATIONS: Current Outpatient Medications on File Prior to Visit  Medication Sig Dispense Refill  . [DISCONTINUED] diphenhydrAMINE (BENADRYL ALLERGY) 25 mg capsule Take 1 capsule (25 mg total) by mouth every 6 (six) hours as needed. 30 capsule 0   No current facility-administered medications on file prior to visit.     PAST MEDICAL HISTORY: Past Medical History:  Diagnosis Date  . Anemia   . Anxiety   . Back pain   . Chest pain   . Dyspnea   . Fatigue   .  Insomnia   . Joint pain   . Lower extremity edema     PAST SURGICAL HISTORY: Past Surgical History:  Procedure Laterality Date  . EYELID REPAIR W/ SKIN GRAFT      SOCIAL HISTORY: Social History   Tobacco Use  . Smoking status: Current Every Day Smoker    Types: E-cigarettes  . Smokeless tobacco: Never Used  Substance Use Topics  . Alcohol use: Yes    Comment: weekend drinker  . Drug use: No    Types: Marijuana    FAMILY HISTORY: Family History  Problem Relation Age of Onset  . Cancer Mother   . Thyroid disease Mother   . Depression Mother   . Anxiety disorder Mother   . Bipolar disorder Mother   . Drug abuse Mother   . Hypertension Father   . Hyperlipidemia Father   . Alcoholism Father   . Drug abuse Father   . Obesity Father     ROS: Review of Systems  Constitutional: Negative for weight loss.  Gastrointestinal: Negative for nausea and  vomiting.  Musculoskeletal:       Negative for muscle weakness  Psychiatric/Behavioral: Positive for depression. Negative for suicidal ideas. The patient is nervous/anxious.     PHYSICAL EXAM: Blood pressure 100/64, pulse 89, temperature 98.7 F (37.1 C), temperature source Oral, height 5\' 8"  (1.727 m), weight 199 lb (90.3 kg), SpO2 100 %. Body mass index is 30.26 kg/m. Physical Exam Vitals signs reviewed.  Constitutional:      Appearance: Normal appearance. She is well-developed. She is obese.  Cardiovascular:     Rate and Rhythm: Normal rate.  Pulmonary:     Effort: Pulmonary effort is normal.  Musculoskeletal: Normal range of motion.  Skin:    General: Skin is warm and dry.  Neurological:     Mental Status: She is alert and oriented to person, place, and time.  Psychiatric:        Mood and Affect: Mood normal.        Behavior: Behavior normal.        Thought Content: Thought content does not include homicidal or suicidal ideation.     RECENT LABS AND TESTS: BMET    Component Value Date/Time   NA 141  08/21/2018 1129   K 4.2 08/21/2018 1129   CL 104 08/21/2018 1129   CO2 22 08/21/2018 1129   GLUCOSE 86 08/21/2018 1129   BUN 7 08/21/2018 1129   CREATININE 0.74 08/21/2018 1129   CALCIUM 9.6 08/21/2018 1129   GFRNONAA 113 08/21/2018 1129   GFRAA 130 08/21/2018 1129   Lab Results  Component Value Date   HGBA1C 5.4 08/21/2018   HGBA1C 5.3 02/27/2018   Lab Results  Component Value Date   INSULIN 25.7 (H) 08/21/2018   INSULIN 25.1 (H) 02/27/2018   CBC    Component Value Date/Time   WBC 9.1 02/27/2018 0921   RBC 4.27 02/27/2018 0921   HGB 11.6 02/27/2018 0921   HCT 35.3 02/27/2018 0921   PLT 345 01/08/2018 1801   MCV 83 02/27/2018 0921   MCH 27.2 02/27/2018 0921   MCHC 32.9 02/27/2018 0921   RDW 13.5 02/27/2018 0921   LYMPHSABS 2.6 02/27/2018 0921   EOSABS 0.4 02/27/2018 0921   BASOSABS 0.1 02/27/2018 0921   Iron/TIBC/Ferritin/ %Sat No results found for: IRON, TIBC, FERRITIN, IRONPCTSAT Lipid Panel     Component Value Date/Time   CHOL 142 02/27/2018 0921   TRIG 46 02/27/2018 0921   HDL 64 02/27/2018 0921   CHOLHDL 2.4 09/15/2017 1007   LDLCALC 69 02/27/2018 0921   Hepatic Function Panel     Component Value Date/Time   PROT 7.1 08/21/2018 1129   ALBUMIN 4.6 08/21/2018 1129   AST 10 08/21/2018 1129   ALT 9 08/21/2018 1129   ALKPHOS 75 08/21/2018 1129   BILITOT 0.3 08/21/2018 1129      Component Value Date/Time   TSH 1.390 02/27/2018 0921   TSH 1.030 04/24/2017 1510     Ref. Range 08/21/2018 11:29  Vitamin D, 25-Hydroxy Latest Ref Range: 30.0 - 100.0 ng/mL 29.2 (L)    OBESITY BEHAVIORAL INTERVENTION VISIT  Today's visit was # 16   Starting weight: 204 lbs Starting date: 02/27/2018 Today's weight : 199 lbs  Today's date: 11/07/2018 Total lbs lost to date: 5    11/07/2018  Height 5\' 8"  (1.727 m)  Weight 199 lb (90.3 kg)  BMI (Calculated) 30.26  BLOOD PRESSURE - SYSTOLIC 100  BLOOD PRESSURE - DIASTOLIC 64   Body Fat % 42.7 %  Total Body Water  (  lbs) 81.8 lbs    ASK: We discussed the diagnosis of obesity with Bethany Young today and Bethany Young agreed to give [name] permission to discuss obesity behavioral modification therapy today.  ASSESS: Bethany Young has the diagnosis of obesity and her BMI today is 30.26 Bethany Young is in the action stage of change   ADVISE: Bethany Young was educated on the multiple health risks of obesity as well as the benefit of weight loss to improve her health. She was advised of the need for long term treatment and the importance of lifestyle modifications to improve her current health and to decrease her risk of future health problems.  AGREE: Multiple dietary modification options and treatment options were discussed and  Bethany Young agreed to follow the recommendations documented in the above note.  ARRANGE: Bethany Young was educated on the importance of frequent visits to treat obesity as outlined per CMS and USPSTF guidelines and agreed to schedule her next follow up appointment today.  Corey Skains, am acting as Location manager for [employer], FNP-C.  I have reviewed the above documentation for accuracy and completeness, and I agree with the above.  - Cayde Held, FNP-C.

## 2018-11-13 ENCOUNTER — Encounter (INDEPENDENT_AMBULATORY_CARE_PROVIDER_SITE_OTHER): Payer: Self-pay | Admitting: Family Medicine

## 2019-01-29 ENCOUNTER — Telehealth (INDEPENDENT_AMBULATORY_CARE_PROVIDER_SITE_OTHER): Payer: 59 | Admitting: Family Medicine

## 2019-01-29 ENCOUNTER — Other Ambulatory Visit: Payer: Self-pay

## 2019-01-29 DIAGNOSIS — E559 Vitamin D deficiency, unspecified: Secondary | ICD-10-CM | POA: Diagnosis not present

## 2019-01-29 DIAGNOSIS — E8881 Metabolic syndrome: Secondary | ICD-10-CM

## 2019-01-29 DIAGNOSIS — E669 Obesity, unspecified: Secondary | ICD-10-CM

## 2019-01-29 DIAGNOSIS — Z6830 Body mass index (BMI) 30.0-30.9, adult: Secondary | ICD-10-CM

## 2019-01-29 DIAGNOSIS — F418 Other specified anxiety disorders: Secondary | ICD-10-CM

## 2019-01-29 MED ORDER — VITAMIN D (ERGOCALCIFEROL) 1.25 MG (50000 UNIT) PO CAPS: 50000.0000 [IU] | ORAL_CAPSULE | ORAL | 0 refills | Status: DC

## 2019-01-29 MED ORDER — ESCITALOPRAM OXALATE 10 MG PO TABS: 10.0000 mg | ORAL_TABLET | ORAL | 0 refills | Status: DC

## 2019-01-30 ENCOUNTER — Telehealth (INDEPENDENT_AMBULATORY_CARE_PROVIDER_SITE_OTHER): Payer: 59 | Admitting: Family Medicine

## 2019-01-30 NOTE — Progress Notes (Signed)
TeleHealth Visit:  Due to the COVID-19 pandemic, this visit was completed with telemedicine (audio/video) technology to reduce patient and provider exposure as well as to preserve personal protective equipment.   Bethany Young has verbally consented to this TeleHealth visit. The patient is located at home, the provider is located at the [facility] office. The participants in this visit include the listed provider and patient and any and all parties involved. The visit was conducted today via WebEx.  Chief Complaint: OBESITY Bethany Young is here to discuss her progress with her obesity treatment plan along with follow-up of her obesity related diagnoses. Bethany Young is keeping a food journal of 1200 to 1250 calories and 80 grams of protein daily and states she is following her eating plan approximately 50% of the time. Bethany Young states she is exercising 0 minutes 0 times per week.  Today's visit was #: 18 Starting weight: 204 lbs Starting date: 02/27/2018  Interim History: Bethany Young has not had an office visit since October 2020 due to a lack of insurance coverage. She is unable to weigh at home. She has journaled sporadically. Bethany Young does a lot of travel between here and [place], where her family is, which makes sticking to the plan more difficult. She is skipping meals.  Subjective:   Vitamin D deficiency [name]'s vitamin D level was at 29.2 (08/21/18) and was not at goal. She admits to fatigue.  Insulin resistance [name] takes metformin sporadically. She sometimes skips meals when she takes metformin 500 mg, and when she doesn't take it, she reports polyphagia.  Depression with anxiety [name] reports stressors from family. She does eat out quite a bit. She may have to take custody of her 62 year old brother. She is unable to eat at times due to stress. She does feel the Lexapro is helping and she is tolerating it well.   Assessment/Plan:   Vitamin D deficiency  Low  Vitamin D level contributes to fatigue and are associated with obesity, breast, and colon cancer. [name] agrees to continue to take prescription Vitamin D @50 ,000 IU every week #4 with no refills and she will follow-up for routine testing of vitamin D, at least 2-3 times per year to avoid over-replacement.  Insulin resistance Bethany Young will continue to work on weight loss, exercise, and decreasing simple carbohydrates to help decrease the risk of diabetes. Bethany Young will continue metformin 1/2 pill (no refill needed) and follow-up with [name] as directed to closely monitor her progress.  Depression with anxiety  [name] agreed to increase the dose of Lexapro to 10 mg daily and a new prescription was written for Lexapro 10 mg daily #30 with no refills. I encouraged her to set up an appointment with a counselor. She will call [employer] (employee assistance program) through her work.  Obesity Bethany Young is currently in the action stage of change. As such, her goal is to continue with weight loss efforts. She has agreed to keeping a food journal and adhering to recommended goals of 1200 to 1300 calories and 80 grams of protein daily.   We discussed the following behavioral modification strategies today: increasing lean protein intake, decreasing eating out, no skipping meals, keeping healthy foods in the home, planning for success and keeping a strict food journal.  Bethany Young has agreed to follow-up with our clinic in 4 weeks (patient requests 4 week follow-up). She was informed of the importance of frequent follow-up visits to maximize her success with intensive lifestyle modifications for her multiple health  conditions.  Objective:   VITALS: Per patient if applicable, see vitals. GENERAL: Alert and in no acute distress. CARDIOPULMONARY: No increased WOB. Speaking in clear sentences.  PSYCH: Pleasant and cooperative. Speech normal rate and rhythm. Affect is appropriate. Insight and judgement are appropriate.  Attention is focused, linear, and appropriate.  NEURO: Oriented as arrived to appointment on time with no prompting.   Lab Results  Component Value Date   CREATININE 0.74 08/21/2018   BUN 7 08/21/2018   NA 141 08/21/2018   K 4.2 08/21/2018   CL 104 08/21/2018   CO2 22 08/21/2018   Lab Results  Component Value Date   ALT 9 08/21/2018   AST 10 08/21/2018   ALKPHOS 75 08/21/2018   BILITOT 0.3 08/21/2018   Lab Results  Component Value Date   HGBA1C 5.4 08/21/2018   HGBA1C 5.3 02/27/2018   Lab Results  Component Value Date   INSULIN 25.7 (H) 08/21/2018   INSULIN 25.1 (H) 02/27/2018   Lab Results  Component Value Date   TSH 1.390 02/27/2018   Lab Results  Component Value Date   CHOL 142 02/27/2018   HDL 64 02/27/2018   LDLCALC 69 02/27/2018   TRIG 46 02/27/2018   CHOLHDL 2.4 09/15/2017   Lab Results  Component Value Date   WBC 9.1 02/27/2018   HGB 11.6 02/27/2018   HCT 35.3 02/27/2018   MCV 83 02/27/2018   PLT 345 01/08/2018   No results found for: IRON, TIBC, FERRITIN   Ref. Range 08/21/2018 11:29  Vitamin D, 25-Hydroxy Latest Ref Range: 30.0 - 100.0 ng/mL 29.2 (L)   Attestation Statements:   Reviewed by clinician on day of visit: allergies, medications, problem list, medical history, surgical history, family history, social history, and previous encounter notes.  Cristi Loron, am acting as Energy manager for [employer], FNP-C.  I have reviewed the above documentation for accuracy and completeness, and I agree with the above. - Jesse Sans, FNP

## 2019-02-26 ENCOUNTER — Other Ambulatory Visit: Payer: Self-pay

## 2019-02-26 ENCOUNTER — Ambulatory Visit (INDEPENDENT_AMBULATORY_CARE_PROVIDER_SITE_OTHER): Payer: 59 | Admitting: Family Medicine

## 2019-02-26 ENCOUNTER — Encounter (INDEPENDENT_AMBULATORY_CARE_PROVIDER_SITE_OTHER): Payer: Self-pay | Admitting: Family Medicine

## 2019-02-26 VITALS — BP 107/72 | HR 81 | Temp 98.6°F | Ht 68.0 in | Wt 209.0 lb

## 2019-02-26 DIAGNOSIS — F418 Other specified anxiety disorders: Secondary | ICD-10-CM | POA: Diagnosis not present

## 2019-02-26 DIAGNOSIS — E559 Vitamin D deficiency, unspecified: Secondary | ICD-10-CM | POA: Diagnosis not present

## 2019-02-26 DIAGNOSIS — E669 Obesity, unspecified: Secondary | ICD-10-CM

## 2019-02-26 DIAGNOSIS — Z9189 Other specified personal risk factors, not elsewhere classified: Secondary | ICD-10-CM

## 2019-02-26 DIAGNOSIS — E8881 Metabolic syndrome: Secondary | ICD-10-CM | POA: Diagnosis not present

## 2019-02-26 DIAGNOSIS — Z6831 Body mass index (BMI) 31.0-31.9, adult: Secondary | ICD-10-CM

## 2019-02-26 MED ORDER — METFORMIN HCL 500 MG PO TABS: 500.0000 mg | ORAL_TABLET | Freq: Every day | ORAL | 0 refills | Status: AC

## 2019-02-26 MED ORDER — VITAMIN D (ERGOCALCIFEROL) 1.25 MG (50000 UNIT) PO CAPS: 50000.0000 [IU] | ORAL_CAPSULE | ORAL | 0 refills | Status: AC

## 2019-02-26 MED ORDER — ESCITALOPRAM OXALATE 10 MG PO TABS: 10.0000 mg | ORAL_TABLET | ORAL | 0 refills | Status: AC

## 2019-02-26 NOTE — Progress Notes (Signed)
Chief Complaint:   OBESITY Bethany Young is here to discuss her progress with her obesity treatment plan along with follow-up of her obesity related diagnoses. Bethany Young is on keeping a food journal and adhering to recommended goals of 1200 to 1250 calories and 30 grams of protein daily plan and states she is following her eating plan approximately 50% of the time. Bethany Young states she is exercising 0 minutes 0 times per week.  Today's visit was #: 18 Starting weight: 204 lbs Starting date: 02/27/2018 Today's weight: 209 lbs Today's date: 02/26/2019 Total lbs lost to date: 0 Total lbs lost since last in-office visit: 0  Interim History: Bethany Young has been journaling over the last week or so because she had gained up to 220 pounds since being weighed at her last in office visit on 11/07/18 when her weight was 199 lbs. . She is now down to 209 pounds. She has been meeting her protein goals for the last week. She prefers journaling to the structure of the Category 2 plan. Her stress significantly affects her eating. She does tend to skip meals.  Subjective:   Vitamin D deficiency  Bethany Young's Vitamin D level was 40.8 and she is not at goal. She is currently taking Rx vit D.  Insulin resistance Bethany Young has a diagnosis of insulin resistance based on her elevated fasting insulin level >5. She takes metformin sporadically, based on her hunger. She has diarrhea when she stops taking it and resumes. She continues to work on diet and exercise to decrease her risk of diabetes.   Lab Results  Component Value Date   INSULIN 25.7 (H) 08/21/2018   INSULIN 25.1 (H) 02/27/2018   Lab Results  Component Value Date   HGBA1C 5.4 08/21/2018   Depression with anxiety  Bethany Young's mood is stable with the increased dose of Lexapro. She does have many stressors concerning her family which affect her ability to eat in a healthy manner.  At risk for deficient intake of food The patient is at a higher than  average risk of deficient intake of food based on current food recall.  Assessment/Plan:   Vitamin D deficiency  Low Vitamin D level contributes to fatigue and are associated with obesity, breast, and colon cancer. Bethany Young agrees to continue to take prescription Vitamin D @50 ,000 IU every week #4 with no refills and we will check vitamin D level today. She will follow-up for routine testing of Vitamin D, at least 2-3 times per year to avoid over-replacement.  Insulin resistance  Bethany Young will continue to work on weight loss, exercise, and decreasing simple carbohydrates to help decrease the risk of diabetes. Bethany Young agreed to continue metformin 500 mg once daily #30 with no refills and follow-up with Bethany Young as directed to closely monitor her progress.  Depression with anxiety Bethany Young will contact [employer] through her job for counseling and make an appointment within the next two days. She agrees to continue Lexapro 10 mg daily #30 with no refills.  At risk for deficient intake of food Bethany Young was given approximately 15 minutes of deficit intake of food prevention counseling today. Bethany Young is at risk for eating too few calories based on current food recall. She was encouraged to focus on meeting caloric and protein goals according to her recommended meal plan.   Class 1 obesity with serious comorbidity and body mass index (BMI) of 31.0 to 31.9 in adult, unspecified obesity type Bethany Young is currently in the action stage of change. As such, her goal  is to continue with weight loss efforts. She has agreed to keeping a food journal and adhering to recommended goals of 1200 to 1300 calories and 80 grams of protein daily.   Exercise goals: No exercise has been prescribed at this time.  Behavioral modification strategies: increasing lean protein intake, decreasing simple carbohydrates, planning for success and keeping a strict food journal.  Bethany Young has agreed to follow-up with our clinic in 3 weeks. She was  informed of the importance of frequent follow-up visits to maximize her success with intensive lifestyle modifications.  Bethany Young was informed we would discuss her lab results at her next visit unless there is a critical issue that needs to be addressed sooner. Bethany Young agreed to keep her next visit at the agreed upon time to discuss these results.  Objective:   Blood pressure 107/72, pulse 81, temperature 98.6 F (37 C), temperature source Oral, height 5\' 8"  (1.727 m), weight 209 lb (94.8 kg), SpO2 98 %. Body mass index is 31.78 kg/m.  General: Cooperative, alert, well developed, in no acute distress. HEENT: Conjunctivae and lids unremarkable. Cardiovascular: Regular rhythm.  Lungs: Normal work of breathing. Neurologic: No focal deficits.   Lab Results  Component Value Date   CREATININE 0.74 08/21/2018   BUN 7 08/21/2018   NA 141 08/21/2018   K 4.2 08/21/2018   CL 104 08/21/2018   CO2 22 08/21/2018   Lab Results  Component Value Date   ALT 9 08/21/2018   AST 10 08/21/2018   ALKPHOS 75 08/21/2018   BILITOT 0.3 08/21/2018   Lab Results  Component Value Date   HGBA1C 5.4 08/21/2018   HGBA1C 5.3 02/27/2018   Lab Results  Component Value Date   INSULIN 25.7 (H) 08/21/2018   INSULIN 25.1 (H) 02/27/2018   Lab Results  Component Value Date   TSH 1.390 02/27/2018   Lab Results  Component Value Date   CHOL 142 02/27/2018   HDL 64 02/27/2018   LDLCALC 69 02/27/2018   TRIG 46 02/27/2018   CHOLHDL 2.4 09/15/2017   Lab Results  Component Value Date   WBC 9.1 02/27/2018   HGB 11.6 02/27/2018   HCT 35.3 02/27/2018   MCV 83 02/27/2018   PLT 345 01/08/2018   No results found for: IRON, TIBC, FERRITIN   Ref. Range 08/21/2018 11:29  Vitamin D, 25-Hydroxy Latest Ref Range: 30.0 - 100.0 ng/mL 29.2 (L)    Attestation Statements:   Reviewed by clinician on day of visit: allergies, medications, problem list, medical history, surgical history, family history, social history,  and previous encounter notes.  Corey Skains, am acting as Location manager for [employer], FNP-C.  I have reviewed the above documentation for accuracy and completeness, and I agree with the above. -  Shaniya Tashiro [employer], FNP-C

## 2019-02-27 ENCOUNTER — Encounter (INDEPENDENT_AMBULATORY_CARE_PROVIDER_SITE_OTHER): Payer: Self-pay | Admitting: Family Medicine

## 2019-02-27 LAB — VITAMIN D 25 HYDROXY (VIT D DEFICIENCY, FRACTURES): Vit D, 25-Hydroxy: 35.3 ng/mL (ref 30.0–100.0)

## 2019-02-27 LAB — HEMOGLOBIN A1C
Est. average glucose Bld gHb Est-mCnc: 108 mg/dL
Hgb A1c MFr Bld: 5.4 % (ref 4.8–5.6)

## 2019-02-27 LAB — INSULIN, RANDOM: INSULIN: 16.4 u[IU]/mL (ref 2.6–24.9)

## 2019-03-18 ENCOUNTER — Encounter (INDEPENDENT_AMBULATORY_CARE_PROVIDER_SITE_OTHER): Payer: Self-pay

## 2019-03-18 ENCOUNTER — Ambulatory Visit (INDEPENDENT_AMBULATORY_CARE_PROVIDER_SITE_OTHER): Payer: 59 | Admitting: Family Medicine

## 2020-02-11 ENCOUNTER — Other Ambulatory Visit: Payer: Self-pay

## 2020-02-11 ENCOUNTER — Ambulatory Visit: Payer: Self-pay

## 2020-02-11 ENCOUNTER — Ambulatory Visit
Admission: EM | Admit: 2020-02-11 | Discharge: 2020-02-11 | Disposition: A | Discharge status: Home / Self Care | Payer: BC Managed Care – PPO | Attending: Emergency Medicine | Admitting: Emergency Medicine

## 2020-02-11 ENCOUNTER — Encounter: Payer: Self-pay | Admitting: Emergency Medicine

## 2020-02-11 DIAGNOSIS — R062 Wheezing: Secondary | ICD-10-CM

## 2020-02-11 DIAGNOSIS — R059 Cough, unspecified: Secondary | ICD-10-CM

## 2020-02-11 MED ORDER — BENZONATATE 100 MG PO CAPS: 100.0000 mg | ORAL_CAPSULE | Freq: Three times a day (TID) | ORAL | 0 refills | Status: AC

## 2020-02-11 MED ORDER — AEROCHAMBER PLUS FLO-VU MEDIUM MISC: 1.0000 | Freq: Once | 0 refills | Status: AC

## 2020-02-11 MED ORDER — ALBUTEROL SULFATE HFA 108 (90 BASE) MCG/ACT IN AERS: 2.0000 | INHALATION_SPRAY | Freq: Four times a day (QID) | RESPIRATORY_TRACT | 2 refills | Status: AC | PRN

## 2020-02-11 MED ORDER — PREDNISONE 50 MG PO TABS: 50.0000 mg | ORAL_TABLET | Freq: Every day | ORAL | 0 refills | Status: AC

## 2020-02-11 NOTE — ED Triage Notes (Signed)
Pt states that she is having chest tightness, SOB, cough, nasal congestion that started last night.

## 2020-02-11 NOTE — ED Provider Notes (Signed)
[name]    CSN: [id number] Arrival date & time: 02/11/20  1540      History   Chief Complaint Chief Complaint  Patient presents with  . Cough  . Shortness of Breath  . Chest Pain    HPI Bethany Young is a 25 y.o. female  History as below presenting for chest tightness, dyspnea with exertion, cough, nasal congestion, wheezing since last night.  States has had this before: Requesting inhaler she does not currently have 1.  No known sick contacts.  Denying chest pain/pressure, lightheadedness or dizziness, fever.  No history of leg swelling, claudication, recent travel or trauma.    Past Medical History:  Diagnosis Date  . Anemia   . Anxiety   . Back pain   . Chest pain   . Dyspnea   . Fatigue   . Insomnia   . Joint pain   . Lower extremity edema     Patient Active Problem List   Diagnosis Date Noted  . Vitamin D deficiency 10/17/2018  . Depression with anxiety 05/30/2018  . Insulin resistance 05/30/2018  . Class 1 obesity with serious comorbidity and body mass index (BMI) of 31.0 to 31.9 in adult 05/30/2018  . Eczematous dermatitis of right upper eyelid 07/13/2017  . 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 Pain in right ankle and joints of right foot 05/08/2017  . Frequent headaches 04/24/2017  . Family history of ovarian cancer 04/24/2017    Past Surgical History:  Procedure Laterality Date  . BUTTOCK LIFT    . EYELID REPAIR W/ SKIN GRAFT    . LIPOSUCTION      OB History    Gravida  2   Para  0   Term  0   Preterm  0   AB  2   Living  0     SAB  0   IAB  0   Ectopic  0   Multiple  0   Live Births  0            Home Medications    Prior to Admission medications   Medication Sig Start Date End Date Taking? Authorizing Provider  albuterol (VENTOLIN HFA) 108 (90 Base) MCG/ACT inhaler Inhale 2 puffs into the lungs every 6 (six) hours  as needed for wheezing or shortness of breath. 02/11/20  Yes Hall-Potvin, [place], PA-C  benzonatate (TESSALON) 100 MG capsule Take 1 capsule (100 mg total) by mouth every 8 (eight) hours. 02/11/20  Yes Hall-Potvin, [place], PA-C  predniSONE (DELTASONE) 50 MG tablet Take 1 tablet (50 mg total) by mouth daily with breakfast. 02/11/20  Yes Hall-Potvin, [place], PA-C  escitalopram (LEXAPRO) 10 MG tablet Take 1 tablet (10 mg total) by mouth every morning. 02/26/19   Whitmire, Thermon Leyland, FNP  metFORMIN (GLUCOPHAGE) 500 MG tablet Take 1 tablet (500 mg total) by mouth daily with breakfast. 02/26/19   Whitmire, Thermon Leyland, FNP  Vitamin D, Ergocalciferol, (DRISDOL) 1.25 MG (50000 UNIT) CAPS capsule Take 1 capsule (50,000 Units total) by mouth every 7 (seven) days. 02/26/19   Whitmire, Thermon Leyland, FNP  diphenhydrAMINE (BENADRYL ALLERGY) 25 mg capsule Take 1 capsule (25 mg total) by mouth every 6 (six) hours as needed. 04/20/18 08/14/18  Claiborne Rigg, NP    Family History Family History  Problem Relation Age of Onset  . Cancer Mother   . Thyroid disease Mother   . Depression Mother   .  Anxiety disorder Mother   . Bipolar disorder Mother   . Drug abuse Mother   . Hypertension Father   . Hyperlipidemia Father   . Alcoholism Father   . Drug abuse Father   . Obesity Father     Social History Social History   Tobacco Use  . Smoking status: Current Every Day Smoker    Types: E-cigarettes  . Smokeless tobacco: Never Used  Vaping Use  . Vaping Use: Every day  Substance Use Topics  . Alcohol use: Yes    Comment: weekend drinker  . Drug use: No    Types: Marijuana     Allergies   Ofloxacin and Zoloft [sertraline hcl]   Review of Systems As per HPI   Physical Exam Triage Vital Signs ED Triage Vitals [02/11/20 1554]  Enc Vitals Group     BP      Pulse      Resp      Temp      Temp src      SpO2 94 %     Weight      Height      Head Circumference      Peak Flow      Pain Score 4     Pain Loc       Pain Edu?      Excl. in GC?    No data found.  Updated Vital Signs BP (!) 135/94 (BP Location: Left Arm)   Pulse (!) 114   Temp 98.2 F (36.8 C) (Oral)   Resp 19   LMP 01/29/2020   SpO2 94%   Visual Acuity Right Eye Distance:   Left Eye Distance:   Bilateral Distance:    Right Eye Near:   Left Eye Near:    Bilateral Near:     Physical Exam Constitutional:      General: She is not in acute distress.    Appearance: She is not ill-appearing or diaphoretic.  HENT:     Head: Normocephalic and atraumatic.     Right Ear: Tympanic membrane and ear canal normal.     Left Ear: Tympanic membrane and ear canal normal.     Mouth/Throat:     Mouth: Mucous membranes are moist.     Pharynx: Oropharynx is clear. No oropharyngeal exudate or posterior oropharyngeal erythema.  Eyes:     General: No scleral icterus.    Conjunctiva/sclera: Conjunctivae normal.     Pupils: Pupils are equal, round, and reactive to light.  Neck:     Comments: Trachea midline, negative JVD Cardiovascular:     Rate and Rhythm: Normal rate and regular rhythm.     Heart sounds: No murmur heard. No gallop.   Pulmonary:     Effort: Pulmonary effort is normal. No tachypnea, accessory muscle usage or respiratory distress.     Breath sounds: No stridor. Wheezing present. No decreased breath sounds, rhonchi or rales.  Musculoskeletal:     Cervical back: Neck supple. No tenderness.  Lymphadenopathy:     Cervical: No cervical adenopathy.  Skin:    Capillary Refill: Capillary refill takes less than 2 seconds.     Coloration: Skin is not jaundiced or pale.     Findings: No rash.  Neurological:     General: No focal deficit present.     Mental Status: She is alert and oriented to person, place, and time.      UC Treatments / Results  Labs (all labs ordered are listed, but  only abnormal results are displayed) Labs Reviewed - No data to display  EKG   Radiology No results  found.  Procedures Procedures (including critical care time)  Medications Ordered in UC Medications - No data to display  Initial Impression / Assessment and Plan / UC Course  I have reviewed the triage vital signs and the nursing notes.  Pertinent labs & imaging results that were available during my care of the patient were reviewed by me and considered in my medical decision making (see chart for details).     Patient afebrile, nontoxic, with SpO2 94%.  Declined covid test.  We will treat supportively as outlined below.  Return precautions discussed, patient verbalized understanding and is agreeable to plan. Final Clinical Impressions(s) / UC Diagnoses   Final diagnoses:  Wheezing  Cough     Discharge Instructions     Tessalon for cough. Start flonase, atrovent nasal spray for nasal congestion/drainage. You can use over the counter nasal saline rinse such as neti pot for nasal congestion. Keep hydrated, your urine should be clear to pale yellow in color. Tylenol/motrin for fever and pain. Monitor for any worsening of symptoms, chest pain, shortness of breath, wheezing, swelling of the throat, go to the emergency department for further evaluation needed.     ED Prescriptions    Medication Sig Dispense Auth. Provider   predniSONE (DELTASONE) 50 MG tablet Take 1 tablet (50 mg total) by mouth daily with breakfast. 5 tablet Hall-Potvin, [place], PA-C   benzonatate (TESSALON) 100 MG capsule Take 1 capsule (100 mg total) by mouth every 8 (eight) hours. 21 capsule Hall-Potvin, [place], PA-C   albuterol (VENTOLIN HFA) 108 (90 Base) MCG/ACT inhaler Inhale 2 puffs into the lungs every 6 (six) hours as needed for wheezing or shortness of breath. 8 g Hall-Potvin, [place], PA-C   Spacer/Aero-Holding Chambers (AEROCHAMBER PLUS FLO-VU MEDIUM) MISC 1 each by Other route once for 1 dose. 1 each Hall-Potvin, [place], PA-C     PDMP not reviewed this encounter.   Odette Fraction [place],  [place] 02/14/20 1905

## 2020-02-11 NOTE — Discharge Instructions (Addendum)

## 2021-08-25 ENCOUNTER — Encounter (INDEPENDENT_AMBULATORY_CARE_PROVIDER_SITE_OTHER): Payer: Self-pay
